# Patient Record
Sex: Female | Born: 1935 | Race: Black or African American | Hispanic: No | Marital: Single | Smoking: Never smoker
Health system: Southern US, Community
[De-identification: ages and names within clinical notes are randomized; demographics above are authoritative.]

## PROBLEM LIST (undated history)

## (undated) DIAGNOSIS — F0280 Dementia in other diseases classified elsewhere without behavioral disturbance: Secondary | ICD-10-CM

## (undated) DIAGNOSIS — L129 Pemphigoid, unspecified: Secondary | ICD-10-CM

## (undated) DIAGNOSIS — G309 Alzheimer's disease, unspecified: Secondary | ICD-10-CM

## (undated) DIAGNOSIS — R609 Edema, unspecified: Secondary | ICD-10-CM

## (undated) DIAGNOSIS — F0390 Unspecified dementia without behavioral disturbance: Secondary | ICD-10-CM

## (undated) DIAGNOSIS — K219 Gastro-esophageal reflux disease without esophagitis: Secondary | ICD-10-CM

## (undated) DIAGNOSIS — I252 Old myocardial infarction: Secondary | ICD-10-CM

## (undated) DIAGNOSIS — M810 Age-related osteoporosis without current pathological fracture: Secondary | ICD-10-CM

## (undated) DIAGNOSIS — I2510 Atherosclerotic heart disease of native coronary artery without angina pectoris: Secondary | ICD-10-CM

## (undated) DIAGNOSIS — E46 Unspecified protein-calorie malnutrition: Secondary | ICD-10-CM

---

## 1998-09-11 ENCOUNTER — Emergency Department (HOSPITAL_COMMUNITY): Admission: EM | Admit: 1998-09-11 | Discharge: 1998-09-11 | Payer: Self-pay | Admitting: *Deleted

## 1998-09-17 ENCOUNTER — Emergency Department (HOSPITAL_COMMUNITY): Admission: EM | Admit: 1998-09-17 | Discharge: 1998-09-17 | Payer: Self-pay | Admitting: Emergency Medicine

## 1999-09-22 ENCOUNTER — Emergency Department (HOSPITAL_COMMUNITY): Admission: EM | Admit: 1999-09-22 | Discharge: 1999-09-22 | Payer: Self-pay | Admitting: Emergency Medicine

## 2000-07-18 ENCOUNTER — Emergency Department (HOSPITAL_COMMUNITY): Admission: EM | Admit: 2000-07-18 | Discharge: 2000-07-18 | Payer: Self-pay | Admitting: Emergency Medicine

## 2000-07-18 ENCOUNTER — Encounter: Payer: Self-pay | Admitting: Emergency Medicine

## 2001-01-21 ENCOUNTER — Inpatient Hospital Stay (HOSPITAL_COMMUNITY): Admission: EM | Admit: 2001-01-21 | Discharge: 2001-02-01 | Payer: Self-pay | Admitting: Emergency Medicine

## 2001-01-21 ENCOUNTER — Encounter: Payer: Self-pay | Admitting: Internal Medicine

## 2001-01-26 ENCOUNTER — Encounter: Payer: Self-pay | Admitting: Internal Medicine

## 2001-01-29 ENCOUNTER — Encounter: Payer: Self-pay | Admitting: Internal Medicine

## 2003-06-27 ENCOUNTER — Inpatient Hospital Stay (HOSPITAL_COMMUNITY): Admission: EM | Admit: 2003-06-27 | Discharge: 2003-07-03 | Payer: Self-pay | Admitting: Emergency Medicine

## 2003-06-29 ENCOUNTER — Encounter: Payer: Self-pay | Admitting: Internal Medicine

## 2003-07-07 ENCOUNTER — Inpatient Hospital Stay (HOSPITAL_COMMUNITY): Admission: RE | Admit: 2003-07-07 | Discharge: 2003-07-10 | Payer: Self-pay | Admitting: Internal Medicine

## 2003-10-14 ENCOUNTER — Emergency Department (HOSPITAL_COMMUNITY): Admission: EM | Admit: 2003-10-14 | Discharge: 2003-10-14 | Payer: Self-pay | Admitting: Emergency Medicine

## 2003-12-31 ENCOUNTER — Emergency Department (HOSPITAL_COMMUNITY): Admission: EM | Admit: 2003-12-31 | Discharge: 2003-12-31 | Payer: Self-pay | Admitting: Emergency Medicine

## 2005-01-10 IMAGING — CR DG CHEST 2V
2 series · 2 of 2 positions shown · non-contrast
Comparison: 06/27/2003.

CLINICAL DATA: Syncope. 
 CHEST (TWO VIEWS)

[view not recorded (1 of 2)]
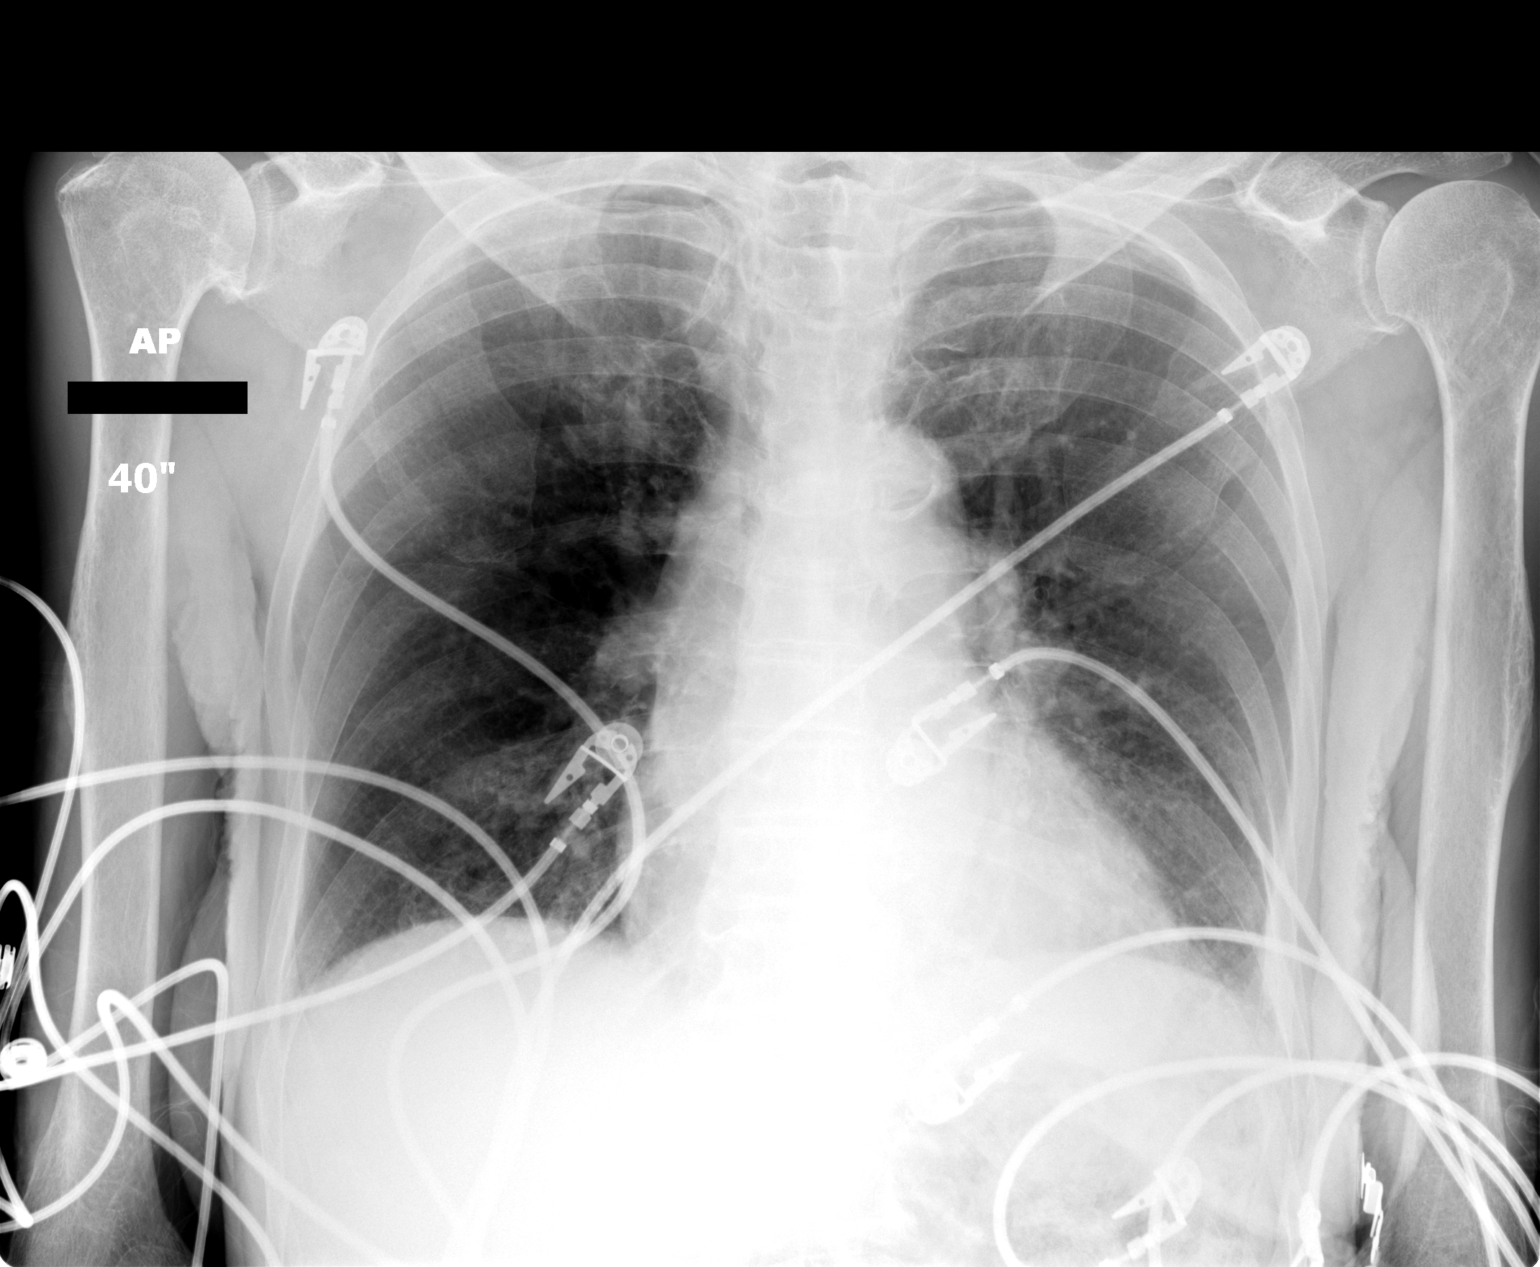

[view not recorded (2 of 2)]
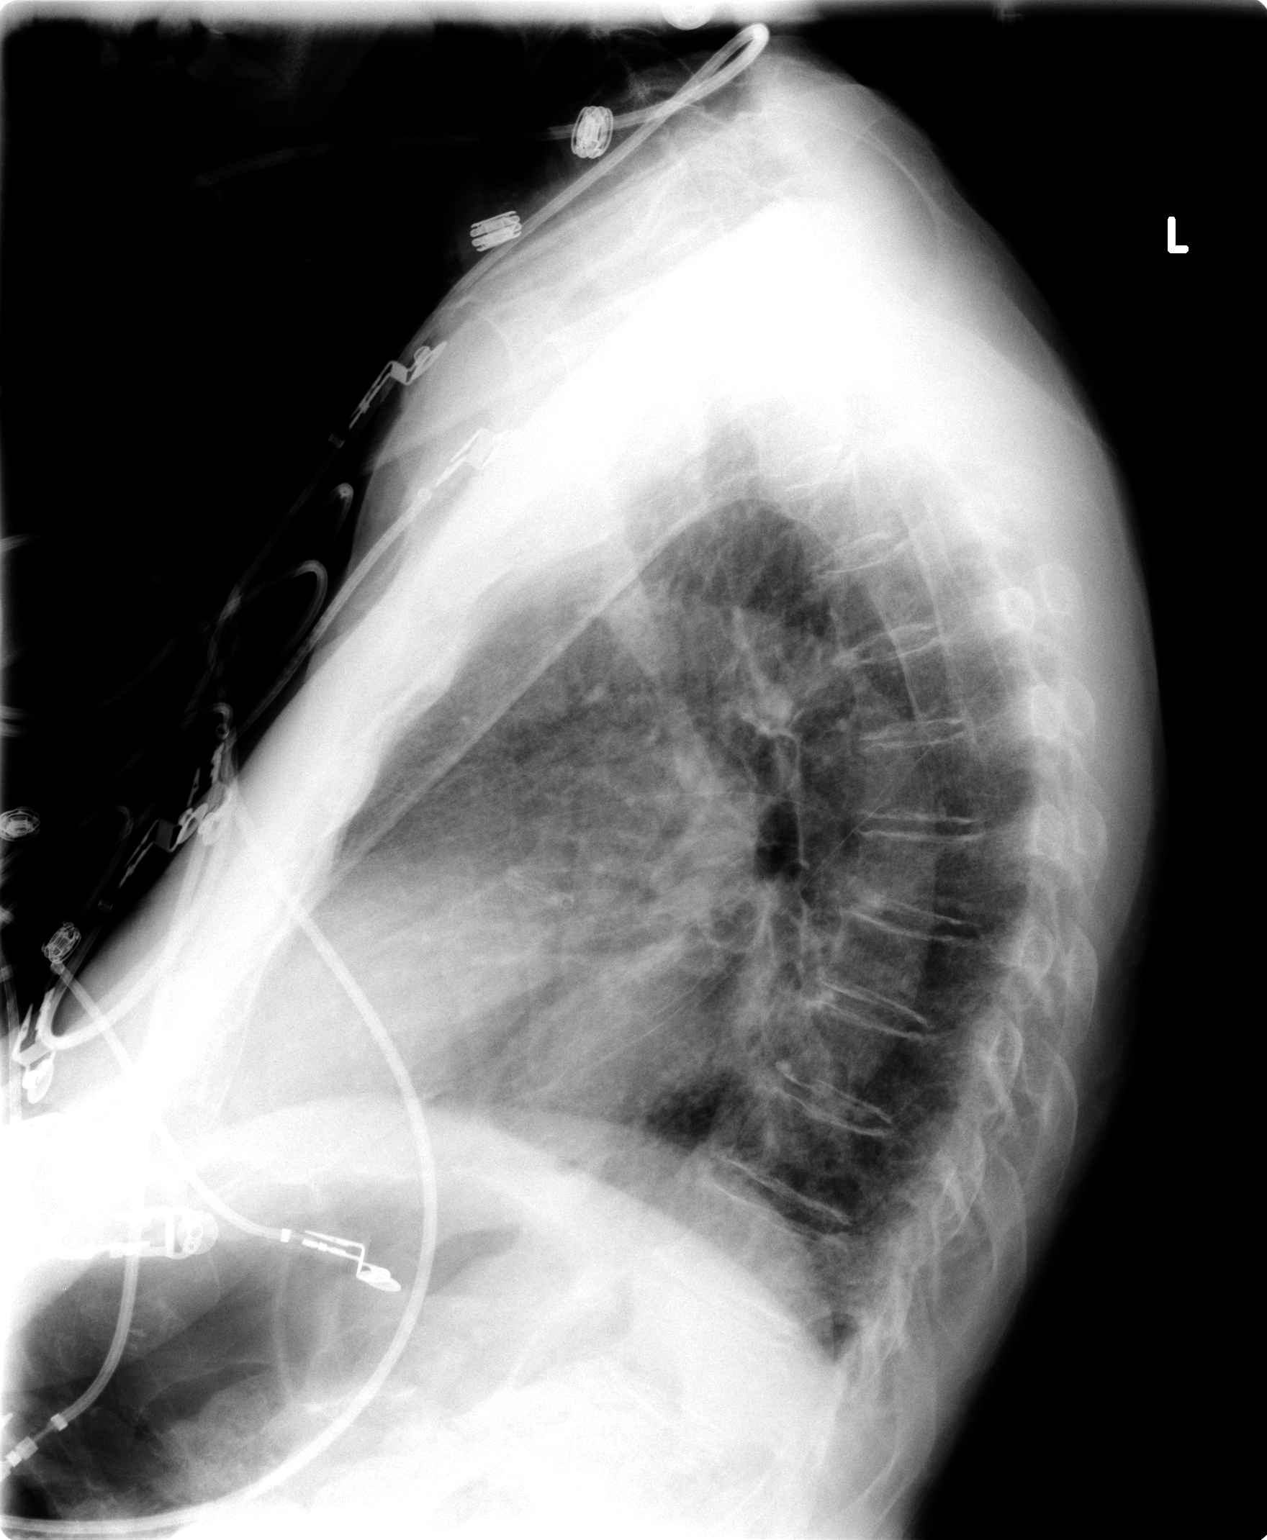

[2 of 2 positions shown; findings below may reference images not displayed]

Cardiomegaly.   Calcified tortuous aorta.  Vascularity normal.  Lower lung volumes than on previous study.  However, no acute infiltrative or effusions seen.  Osteoporosis.
 IMPRESSION
 Cardiomegaly.  No acute abnormalities.

## 2005-01-10 IMAGING — CT CT HEAD W/O CM
1 of 2 series · 13 of 30 positions shown, 17 images · non-contrast
Comparison: none

CLINICAL DATA: Syncope.  Left frontal hematoma.

[Series 2: brain · axial · 0.47mm/px · z∈[+107,+228]mm · 13 of 28 slices shown, 17 images]
[im 2/28  brain]
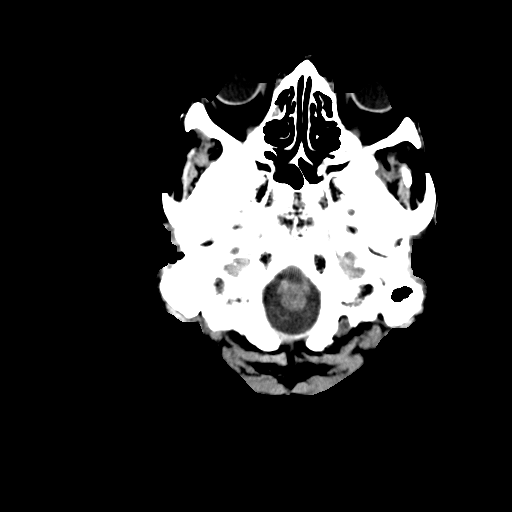
[im 2/28  bone]
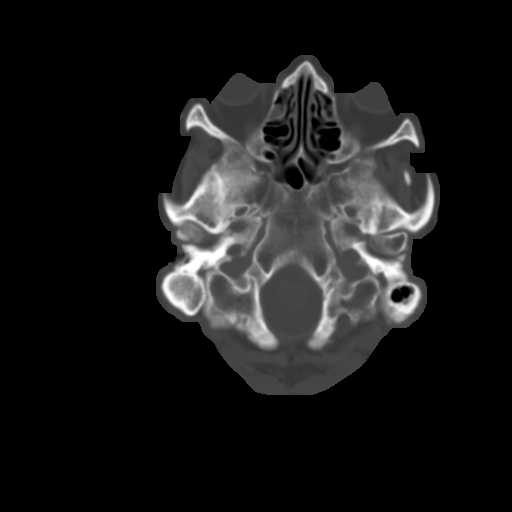
[im 4/28  brain]
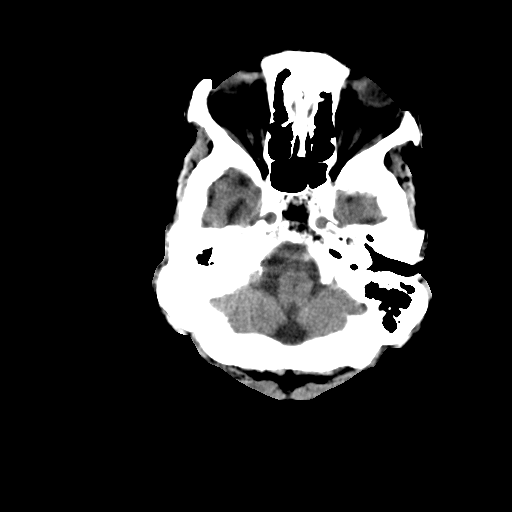
[im 6/28  brain]
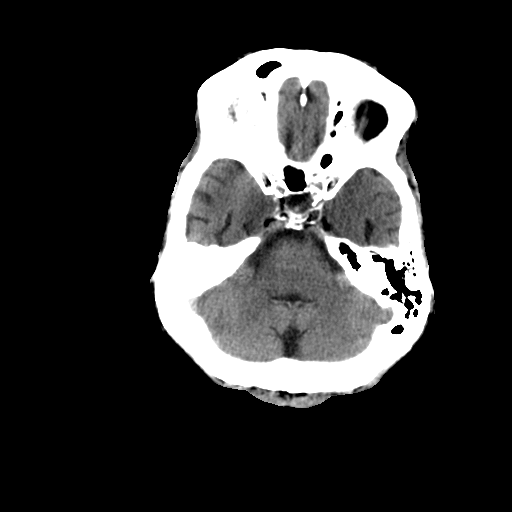
[im 8/28  brain]
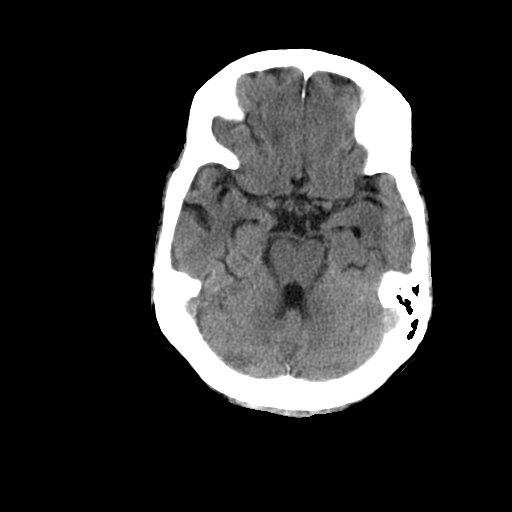
[im 10/28  brain]
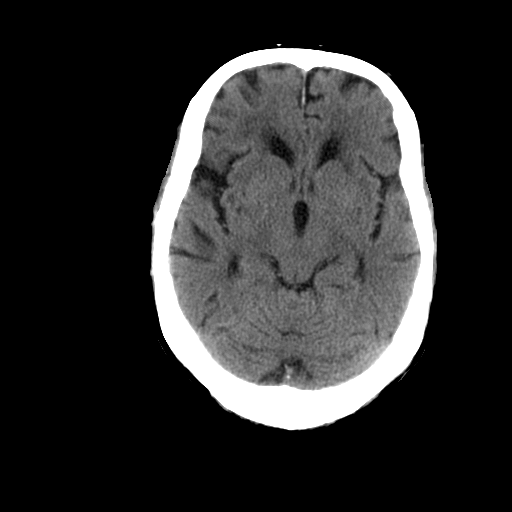
[im 10/28  bone]
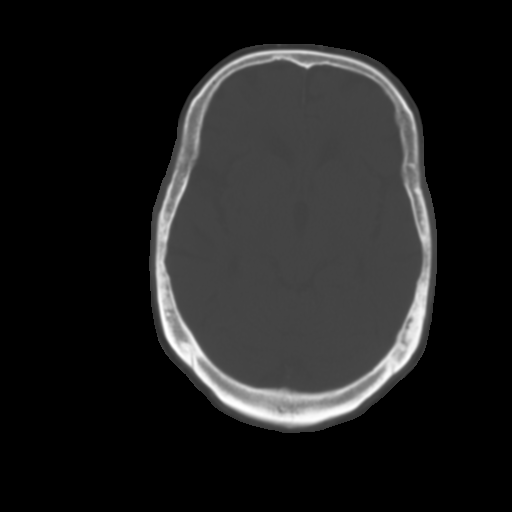
[im 12/28  brain]
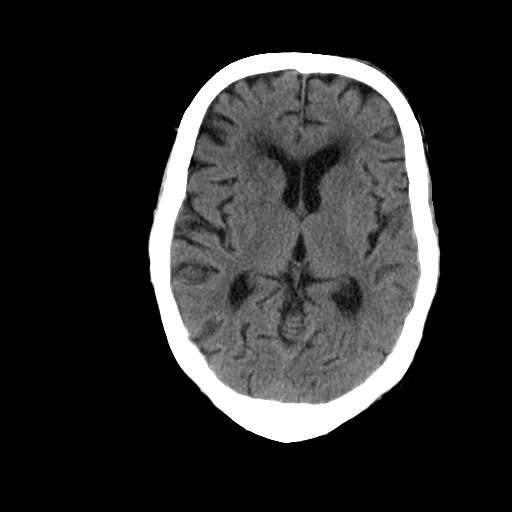
[im 14/28  brain]
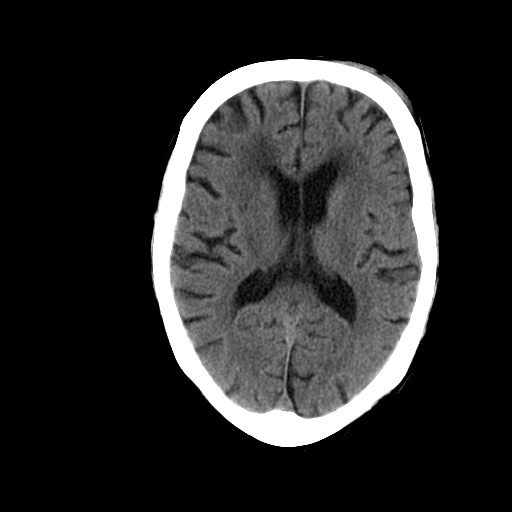
[im 16/28  brain]
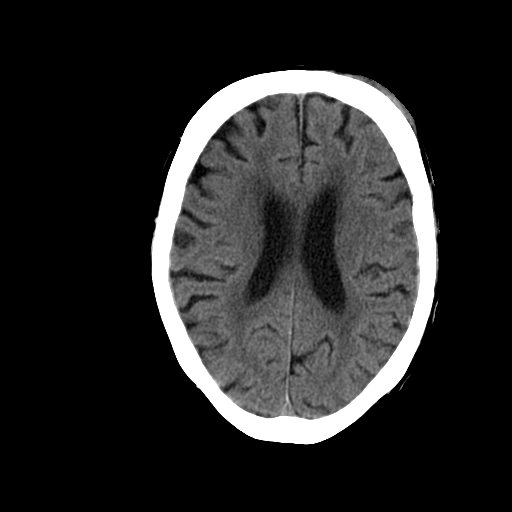
[im 18/28  brain]
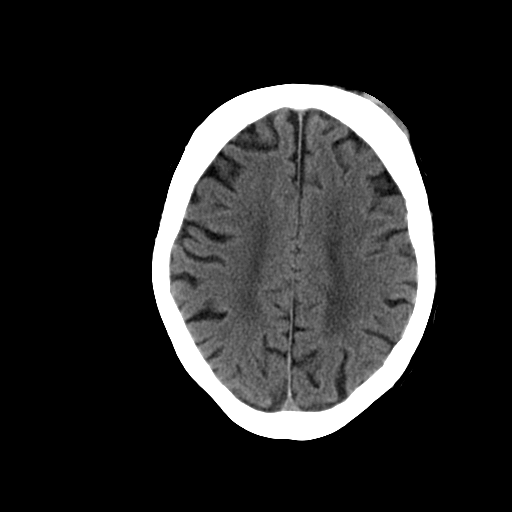
[im 18/28  bone]
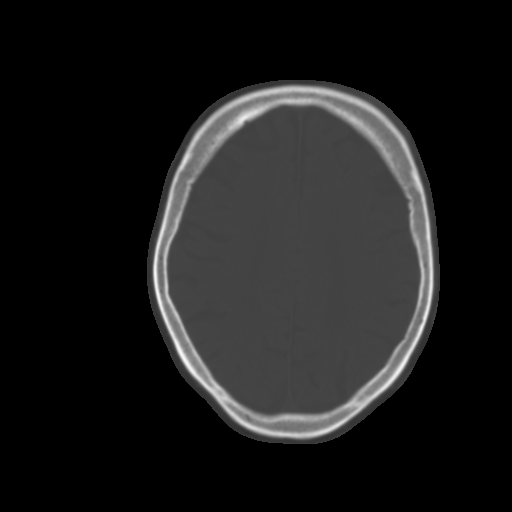
[im 20/28  brain]
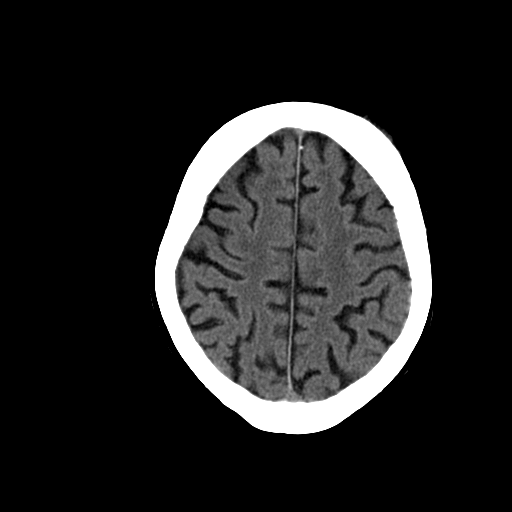
[im 22/28  brain]
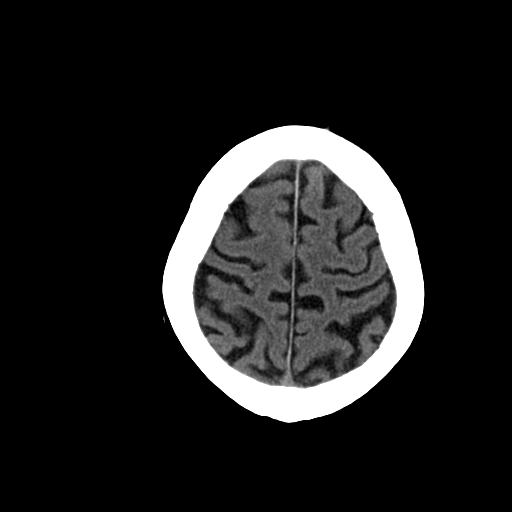
[im 24/28  brain]
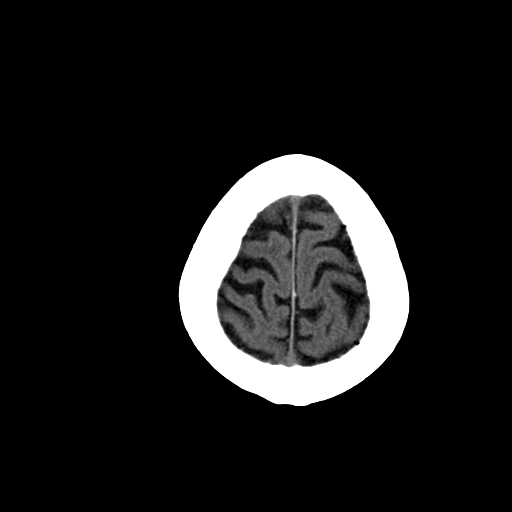
[im 26/28  brain]
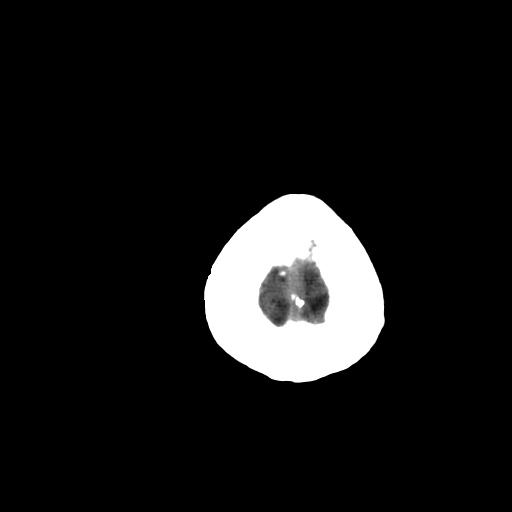
[im 26/28  bone]
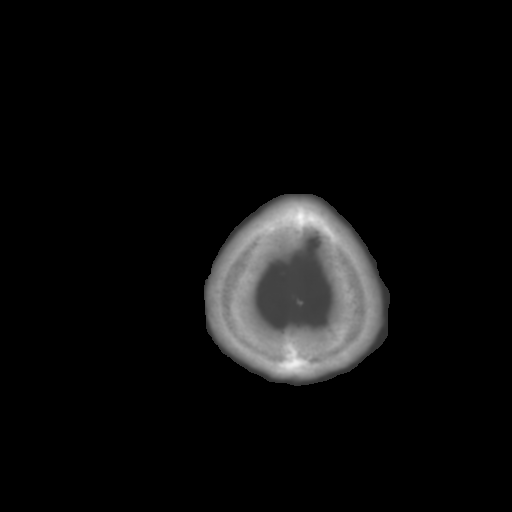

[13 of 30 positions shown; findings below may reference images not displayed]

CT HEAD WITHOUT CONTRAST
 Routine noncontrast CT head compared to 01/26/01.  Generalized atrophy.  Small vessel chronic ischemic changes of the deep cerebral white matter.  No midline shift or mass effect.  No intracranial hemorrhage, evidence of acute infarct.  Calvarium intact.  Left frontal scalp hematoma noted.  

 IMPRESSION
 Left frontal scalp hematoma.  No acute intracranial abnormalities. 

 [REDACTED]

## 2005-03-03 ENCOUNTER — Inpatient Hospital Stay (HOSPITAL_COMMUNITY): Admission: EM | Admit: 2005-03-03 | Discharge: 2005-03-11 | Payer: Self-pay | Admitting: Emergency Medicine

## 2005-03-03 ENCOUNTER — Ambulatory Visit: Payer: Self-pay | Admitting: Infectious Diseases

## 2007-10-08 ENCOUNTER — Emergency Department (HOSPITAL_COMMUNITY): Admission: EM | Admit: 2007-10-08 | Discharge: 2007-10-09 | Payer: Self-pay | Admitting: Emergency Medicine

## 2007-11-25 ENCOUNTER — Emergency Department (HOSPITAL_COMMUNITY): Admission: EM | Admit: 2007-11-25 | Discharge: 2007-11-25 | Payer: Self-pay | Admitting: Emergency Medicine

## 2010-11-08 NOTE — Consult Note (Signed)
[place]. [facility]  Patient:    [name] [name], [name] [name]                      MRN: [id number] Proc. Date: 01/26/01 Adm. Date:  [id number] Disc. Date: [id number] Attending:  Georgann Housekeeper CC:         Thora Lance, M.D.   Consultation Report  REFERRING:  Thora Lance, M.D.  CHIEF COMPLAINT:  Respiratory arrest and abnormal EKG.  HISTORY OF PRESENT ILLNESS:  This is a 77 year old African-American female taken care of by [facility] who was admitted on January 21, 2001 with complaints of 4-5 day history of hallucinations and delusions.  The patient was found to have a positive RPR with no history of syphilis in the past. Also, has a history of hypertension.  Upon admission, she was hypokalemic and dehydrated felt secondary to poor nutrition.  Her delusions initially appeared to resolve after repletion of potassium and IV fluids.  She was found to be anemic as well on admission.  On August 4, she began hallucinating again.  A 2-D echocardiogram was done showing normal LV size and systolic function. Diastolic dysfunction was present with mild tricuspid regurgitation.  EKG on admission showed sinus bradycardia with 1-2 mm of J point elevation in V1-V4 with prominent R waves and increased voltage in V2 and V3 with biphasic T wave changes in V3-V6 and LVH by voltage criteria.  The ST segments have transiently changed with a degree of LVH noted on EKG.  Today at 1435, a Code Blue was called secondary to respiratory distress.  Apparently, the patient stopped breathing momentarily.  She never lost her pulse or blood pressure. The patient began mentating approximately 15 minutes after the event started. EKG at that time showed very slight J point elevation from baseline in V2-V4. V2 had appeared to be 3 mm of J point elevation, V3 2 mm of J point elevation, and V4 1-2 mm of J point elevation.  This was slightly increased from her initial EKG done on admission.  Her blood  pressure during the event was 176/96 with a heart rate of 70, temperature 100.1.  The patient is a very poor historian but is somewhat awake now and denies ever having had chest pain.  PAST MEDICAL HISTORY:  Hypertension, questionable CVA two years ago, depression, Alzheimers, hallucinations and delusions, positive RPR with no history of syphilis.  ALLERGIES:  None.  SOCIAL HISTORY:  She lives with a daughter.  She is single.  Denies tobacco or alcohol use.  She uses a walker at home.  FAMILY HISTORY:  Positive for hypertension and CVA in her mother.  PHYSICAL EXAMINATION:  VITAL SIGNS:  Blood pressure 140-170/60 mmHg, heart rate 80-90, respiratory rate 20.  O2 saturations 100% on 2 L.  GENERAL:  This is a thin black female in no acute distress, somewhat sedated, but appropriate and answers questions.  HEENT:  Benign except for poor dentition.  NECK:  Supple without lymphadenopathy.  No bruits.  LUNGS:  Clear to auscultation throughout.  HEART:  Regular rate and rhythm.  No murmurs, rubs, or gallops.  Normal S1, S2.  ABDOMEN:  Soft, nontender, nondistended with active bowel sounds.  No hepatosplenomegaly.  EXTREMITIES:  No cyanosis, erythema, or edema.  LABORATORY DATA:  Sodium 139, potassium 4.0, chloride 109, CO2 24, BUN 17, creatinine 1, glucose 132.  White cell count 6.1, hemoglobin 11.7, hematocrit 35.4, platelet count 258.  LFTs within normal limits.  CPK 306,  MB 2.3, normal relative index, troponin 0.02.  EKG #1 done today shows normal sinus rhythm at 69 beats per minute with LVH by voltage with J point elevation 2-3 mm in V1-V4.  This about 1 mm more than her baseline EKG on admission.  EKG #2 done after the event shows normal sinus rhythm with J point elevation which has decreased to the same as on admission in the precordial leads.  EKG also shows LVH with stream pattern.  ASSESSMENT:  Transient respiratory distress with stable hemodynamics, questionable  etiology, probable drug interaction, unlikely to represent a cardiac event with normal blood pressure and heart rate, as well as normal heart rhythm during the event.  EKG does show J point elevation of 1-2 mm in V1-V4 on admission and slight increase after her respiratory distress but repeat EKG now shows no changes from the admission EKG.  The patient does have prominent LVH on EKG, which may be affecting ST changes on her EKG (LVH with repolarization changes).  I do not think this represents an acute MI.  Her initial cardiac enzymes with the CPK and MB and troponin-I are negative.  It is difficult to get a history out of her because she denies any chest pain at present.  PLAN:  Follow CPK, MB, enzymes, and troponins.  At present, I would not take this patient to cardiac catheterization, unless she has some evidence of acute myocardial ischemia noted with a cardiac enzyme bump.  Agree with IV nitroglycerin, aspirin, and oxygen in the interim.  Chest CT and head CT are pending.  Needs aggressive blood pressure management.  We will follow with you. DD:  01/26/01 TD:  01/27/01 Job: [id number]

## 2010-11-08 NOTE — Cardiovascular Report (Signed)
NAMECLARESSA, [name] [name]                         ACCOUNT NO.:  [id number]   MEDICAL RECORD NO.:  [id number]                   PATIENT TYPE:  INP   LOCATION:  2930                                 FACILITY:  [facility]   PHYSICIAN:  Arturo Morton. Riley Kill, M.D.             DATE OF BIRTH:  1922-02-16   DATE OF PROCEDURE:  DATE OF DISCHARGE:  07/03/2003                              CARDIAC CATHETERIZATION   INDICATION:  The patient is an elderly, 77 year old woman with poor  functional status, who presents to the emergency room with evidence of ST  elevation myocardial infarction.  She was brought to the laboratory  emergently for percutaneous reperfusion.   PROCEDURE:  1. Left-heart catheterization.  2. Selective coronary arteriography.  3. Selective left ventriculography.  4. Percutaneous stenting of the left anterior descending artery.   DESCRIPTION OF PROCEDURE:  The patient was brought to the catheterization  laboratory emergently for acute reperfusion therapy.  Coronary arteriography  was performed with views obtained of the left and right coronary arteries.  This was followed by left ventriculography.  With appropriate  anticoagulation, the left anterior descending artery was noted to be totally  occluded.  A 6-French FL4 guiding catheter was utilized with a high-torque  floppy wire.  We were able to cross into the left anterior descending  artery, and initial dilatation was performed with a 2.5 x15 Guidant Voyager  Balloon at 2, 4 and 6 atmospheres.  Because of the patient's uncertain  status, it was felt that non-DES stenting would be the most appropriate  approach, given need for prolonged antiplatelet therapy.  A 2.75 x24 Express  Stent was then placed with careful attention to location.  It required  pulling this up to near the takeoff of the left anterior descending artery,  and then this was deployed at 12 atmospheres.  Following that, a 2.75  Express was then taken distally for a  focal lesion just beyond the  previously-placed stent.  This was deployed.  The 2.75 Express balloon was  then used to do multiple high-pressure inflations throughout the previously  placed stent.  The patient had TIMI 0 flow at the beginning of the  procedure, and TIMI 3 flow at the completion of the procedure.  Stenosis was  totally occluded and went to 0 percent at the completion of the procedure.  Integrilin was used as a glycoprotein inhibitor.  Arrival at the facility  was at 1545, and first balloon inflation was at 1719, and reperfusion at  1715 for a total reperfusion time of 90 minutes.  All catheters were  subsequently removed.  The femoral sheath was sewn into place, and the  patient was taken to the coronary care unit in satisfactory clinical  condition.   HEMODYNAMIC DATA:  1. Central aortic pressure 132/74, mean 101.  2. Left ventricle 120/16.  LVEDP at 28.  3. Approximate 8 mm gradient on pullback across the aortic valve.  ANGIOGRAPHIC DATA:  The left main coronary artery is large in caliber,  somewhat calcified, but without significant high-grade focal narrowing.   The left anterior descending artery at the beginning of the procedure  demonstrated a 70-80% proximal area of narrowing followed by a total  occlusion just after the takeoff of the small diagonal branch.  Following  reperfusion therapy, the entire proximal segment was reduced from 100% to 0%  and residual luminal narrowing with TIMI 3 flow at the completion of the  procedure.  There was some diffuse, segmental irregularity of 50% in the mid  left anterior descending artery.  However, there was good antegrade flow  into the distal vessel.   The circumflex coronary artery is fairly calcified and provides a first  marginal branch which is about 30 to 40% ostial narrowing.  There is perhaps  40 to 50% mid narrowing in this vessel.  The AV circumflex just beyond this  has probably about 50% luminal  irregularity.  The second obtuse marginal  branch is totally occluded and fills by retrograde collaterals.  The AV  circumflex provides a third marginal branch.  There is about 40-50%  narrowing in the AV circumflex.  However, none of these areas appear to be  critical.   The right ostium is calcified with about 40% ostial narrowing, and then 40-  50% mid narrowing in the mid vessel.  The distal vessel provides a posterior  descending and posterolateral branch.   VENTRICULOGRAPHY:  Ventriculography performed in the RAO projection reveals  an ejection fraction estimated in the range of about 30 to 35%.  The  anteroapical and distal inferior wall all appear to be akinetic.  There is  mild mitral regurgitation.  Some of this is catheter induced from the  ventricular ectopy.   CONCLUSIONS:  1. Moderately-severe reduction in left ventricular function with an     anteroapical wall-motion abnormality corresponding to the left anterior     descending territory.  2. 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  3. Scattered coronary abnormalities as described in the above text.   DISPOSITION:  The patient's baseline status is poor.  She will be  transferred to the coronary care unit and stabilized.  Non-DES stenting was  utilized because of the uncertainty of use of long-term Plavix.  At the  present time, she will be continued on dual antiplatelet therapy.                                               Arturo Morton. Riley Kill, M.D.    TDS/MEDQ  D:  07/22/2003  T:  07/23/2003  Job:  [id number]   cc:   [facility]

## 2010-11-08 NOTE — Discharge Summary (Signed)
[place]  Patient:    [name] [name], [name] [name]                      MRN: [id number] Adm. Date:  [id number] Disc. Date: [id number] Attending:  Georgann Housekeeper                           Discharge Summary  REASON FOR ADMISSION:  This is a 77 year old African-American female, a chronic patient of [facility], who was brought to the emergency room complaining of 4-5 days of having hallucinations and delusions.  She was brought by her daughter, with whom she lives.  The patient reported seeing alligators in her home and had seen a bald man come into her house.  She was talking to a boy outside of her home when no one was present.  She had been seeing mice.  The patients daughter did give a history of chronic memory loss and symptoms consistent with dementia over at least a two-year period of time.  PAST MEDICAL HISTORY:  The patients past medical history is remarkable for a history of hypertension not taking any medications over the last year, possibly a small stroke about two years ago, and a question of depression.  PHYSICAL EXAMINATION:  VITAL SIGNS:  Blood pressure 212/105, dropped to 190/90 when checked by the M.D.  Heart rate 87, temperature 97.0, respirations 18.  Oxygen saturation 95% on room air.  GENERAL:  A mildly cachectic elderly female who was alert and oriented x 2. The rest of her exam was unremarkable.  NEUROLOGIC:  She was alert and oriented x 2.  Cranial nerves II-XII are intact.  Strength was 4+/5 in the lower extremities and 5/5 in the upper extremities.  Reflexes were 1+ and symmetric with the right toe upgoing and the left toe equivocal.  ADMISSION LABORATORY DATA:  CBC:  WBC 5.1, hemoglobin 10.7, platelet count 241.  Arterial blood gas:  pH 7.36, PCO2 42, PO2 123 on room air. Chemistries:  Sodium 142, potassium 2.3, chloride 105, bicarbonate 30, BUN 9, creatinine 0.7, glucose 80, calcium 8.7, total protein 6.9, albumin 3.5,  AST 16, ALT 12, alkaline phosphatase 62, total bilirubin 0.9.  Creatinine kinase 417, creatinine kinase MB 8.1, troponin-I 0.02.  TSH 0.925.  Urine drug screen negative.  Urinalysis was unremarkable.  Chest x-ray showed no acute disease.  EKG showed sinus bradycardia, nonspecific T-wave changes, and possible LVH.  HOSPITAL COURSE: #1 - HALLUCINATIONS AND DEMENTIA:  The patient was admitted mainly because of troubling visual hallucinations.  She had a CT scan of the brain which showed small vessel disease but no acute changes.  After obtaining more history from the patients daughter, it was obvious she had dementia of moderate to severe severity.  This was felt either to be Alzheimers dementia or possibly a vascular dementia.  The patient continued to have hallucinations during the first several days of her hospitalization.  She was frequently agitated and had trouble with sundowning.  She was treated with a low dose of Ativan IV on a p.r.n. basis.  She was also started on Zyprexa 2.5 mg titrated up to 5 mg q.d.  When after having a respiratory arrest, this was discontinued.  She was briefly treated with Aricept, which also was discontinued after respiratory arrest.  She continued to be intermittently confused and sometimes agitated during her hospitalization.  Her behavioral symptoms were much easier to control when her family  members were present.  A Posey belt was used at times for patient safety.  #2 - RESPIRATORY ARREST:  On August 6, the patient suddenly stopped breathing. A code was called.  She maintained a normal blood pressure and normal heart rhythm with no evidence of arrhythmia.  She was briefly ventilated with a bag ventilator and within 15-20 minutes her status returned to normal.  An EKG at the time showed possible ST elevations in leads V2-V4, which was reviewed by Dr. Armanda Magic, cardiology, who felt that her changes were not indicative of an MI.  Her cardiac enzymes  proved to be negative.  A CT scan of the head was done to rule out a CVA (she had been noted to drag her left leg earlier that day) and was, as above, no acute disease.  CT scan of the chest showed no pulmonary embolus.  The patient was briefly treated with IV nitroglycerin but this was stopped after she ruled out for an MI.  She had another episode of severe hypotension later in the hospitalization, as described below.  After the brief respiratory arrest, her Aricept and Zyprexa were discontinued.  #3 - HYPERTENSION:  The patient was hypertensive at admission and had had a history of hypertension.  Her blood pressure was quite elevated at times, often in conjunction with confusion and agitation.  She was placed on Vasotec at the beginning of her hospitalization.  When the patient was calm, her blood pressure was quite well controlled.  On August 8, the patient had an episode of hypotension while sitting upright in a wheelchair with systolic blood pressure dropping into the 70s.  She was briefly nonresponsive but after lying down and having oxygen applied her blood pressure came up and she again became responsive.  She was given IV fluids and transferred briefly to the ICU.  Her Vasotec was discontinued.  The dose of her Risperdal was decreased.  The next day, orthostatic vital signs were checked and she was negative for orthostasis.  Her blood pressure remained stable throughout the rest of the hospitalization with no further episodes of hypotension off her Vasotec.  #4 - LOW-GRADE FEVER:  The patient had a low-grade fever on August 6. Urinalysis and chest x-ray were negative and no obvious source of infection was identified.  #5 - HYPOKALEMIA:  The patient had significant hypokalemia at admission, which was replaced.  By August 3, her potassium was 4.7 and remained normal throughout the rest of her hospitalization.  #6 - POSITIVE RAPID PLASMA REAGIN:  The patient had an RPR drawn for  her dementia workup which was positive at 1:2.  A TTPA test was positive. Infectious disease was consulted.  Dr. Roxan Hockey felt that the patients  dementia was unlikely to be related to syphilis and that the patient would be unlikely to benefit from treatment for syphilis.  This was discussed with the family and they agree with not treating.  #7 - SYSTOLIC MURMUR:  The patient was noted to have a murmur at admission. An echocardiogram was obtained and showed increased turbulence through the left ventricular outflow tract with minimal outflow tract gradient of 14 mmHg. Left ventricular systolic function was normal.  EF was estimated between 55-65%.  Diastolic dysfunction was documented.  Aortic valve thickness was mildly increased but there was no significant stenosis.  #8 - IRON DEFICIENCY:  The patient was found to have a mild anemia at admission.  Iron studies showed an iron level of 36, TIBC of 237, and  a percent saturation of 15%, suggesting mild iron deficiency.  The patient was placed on iron.  Stool for occult blood was negative x 2.  #9 - FALL:  On August 9, the patient fell after getting out of her bed and attempting to ambulate.  She complained of some left hip pain.  An x-ray of the hip showed no fracture but did suggest a chronic dislocation of the left hip.  An orthopedics consult for this is pending at this time.  #10 - CODE STATUS:  Code status was discussed with the family and the patients family desired a full code status.  DISCHARGE DIAGNOSES: 1. Dementia. 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 3. Respiratory arrest. 4. Hypertension. 5. Episode of severe hypotension. 6. Hypokalemia. 7. Iron-deficiency anemia. 8. Fall.  PROCEDURES: 1. CT scan of the brain. 2. CT scan of the chest. 3. Echocardiogram.  DISCHARGE MEDICATIONS: 1. Iron sulfate 325 mg 1 p.o. q.d. 2. Risperdal 0.5 mg q.12h. p.r.n. agitation or anxiety.  DIET:  Regular.  DISPOSITION:  The patient  was discharged to [facility] nursing home.  She will likely need a skilled level of care initially but ultimately may be able to transition to an intermediate care status.  She may benefit from physical therapy.  CODE STATUS:  Full code. DD:  01/30/01 TD:  01/30/01 Job: [id number]

## 2010-11-08 NOTE — Discharge Summary (Signed)
. [facility]  Patient:    [name] [name], [name] [name]                      MRN: [id number] Adm. Date:  [id number] Disc. Date: [id number] Attending:  Georgann Housekeeper                           Discharge Summary  CONTINUATION:  ADDENDUM:  The patient was seen by Dr. Otelia Sergeant of the orthopedics service on January 30, 2001.  His diagnosis was chronic left hip dislocation.  He did not recommend any treatment of the chronically dislocated left hip.  He felt the patient would be able to weightbear as tolerated on the left leg.  He did say the patient may need a shoe lift. DD:  02/01/01 TD:  02/01/01 Job: [id number]

## 2010-11-08 NOTE — H&P (Signed)
NAMELATEKA, RADY                         ACCOUNT NO.:  [id number]   MEDICAL RECORD NO.:  [id number]                   PATIENT TYPE:  EMS   LOCATION:  MAJO                                 FACILITY:  [facility]   PHYSICIAN:  [name], M.D.                   DATE OF BIRTH:  04-08-1922   DATE OF ADMISSION:  06/27/2003  DATE OF DISCHARGE:                                HISTORY & PHYSICAL   CHIEF COMPLAINT:  Chest pain.   HISTORY OF PRESENT ILLNESS:  The patient is a 77 year old black female who  presents for assessment of chest discomfort.  The patient has no prior  cardiac history.  She is a resident of [facility] with a history  of hypertension and tobacco use.  She has not had any recent chest pain or  shortness of breath, however, today at approximately lunchtime she developed  midsternal chest discomfort with mild shortness of breath.  She was able to  tell her nurses and they subsequently called EMS and she was transferred to  [facility] emergently where EKG showed ST elevation in the anterior  lead consistent with acute anterior wall myocardial infarction.  The patient  had slightly reduced blood pressure and heart rate in the nursing home, but  denies any dizziness or lightheadedness.  She has not had any recent  syncope, although, she has had a remote history of syncope with  noncontributory workup.  She has not had any lower extremity edema,  orthopnea, or paroxysmal nocturnal dyspnea.  No bright red blood per rectum,  melena, or hematuria.   REVIEW OF SYSTEMS:  Otherwise noncontributory, but is somewhat limited due  to mild dementia.  History is also obtained from the patient's daughter.   PAST MEDICAL HISTORY:  Notable for hypertension, dementia, syncope, and iron  deficiency anemia.  She has not had any surgeries.   ALLERGIES:  No known drug allergies.   MEDICATIONS:  1. Iron 325 mg daily.  2. Zyprexa 2.5 mg a day.  3. Remeron 50 mg daily.   SOCIAL HISTORY:  The patient is widowed and has two siblings and two  daughters without medical problems.  She lives at [place].  She has a  history of tobacco use of greater than one pack per day for most of her  adult life, quit a year ago.  Denies alcohol use.   FAMILY HISTORY:  Noncontributory.   PHYSICAL EXAMINATION:  GENERAL:  She is a frail, elderly, black female in no  acute distress.  VITAL SIGNS:  Afebrile, pulse 78, normal sinus rhythm, respiratory rate 18,  blood pressure 115/68.  HEENT:  Pupils equal, round, and reactive to light.  There are cataracts  bilaterally with arcus senilis.  Extraocular muscles are intact.  Oropharynx  shows poor dentition with significant loss of teeth, some necrosis noted on  her lower front teeth.  No upper teeth are  noted.  Mucous membranes are dry.  NECK:  Supple.  No bruits, no adenopathy.  HEART:  Regular rate and rhythm without murmurs, rubs, or gallops.  LUNGS:  Clear to auscultation bilaterally.  ABDOMEN:  Soft and nontender.  EXTREMITIES:  No edema.  Peripheral pulses are diminished, but palpable.  Motor strength is 5/5.  Sensory is grossly intact to touch.   EKG shows normal sinus rhythm at 77, ST elevation of greater than 1 mm in  the anterior leads.   LABORATORY DATA:  Hemoglobin 12.6, hematocrit 37.  Sodium 138, potassium  4.1, chloride 106, bicarb 35, BUN 28, creatinine 1.0, glucose 143.   ASSESSMENT:  The patient is a 77 year old black female who presents with  acute onset of chest discomfort with EKG findings of anterior wall  myocardial infarction.  Treatment options were discussed with the patient  and her daughter including medical therapy, thrombolytics, and coronary  angiography with an eye toward percutaneous intervention.  The risks,  benefits, and alternatives of each were discussed in detail and the patient  and her daughter wish to proceed with cardiac catheterization.  At this  point, the patient is a full code.   We will anticoagulate with aspirin and  Plavix as well as heparin as needed.  She will receive further  anticoagulation at the time of her coagulation.  We will consider addition  of beta blocker as hemodynamics allow and risk factor modification will be  pursued following her angiography.                                                [name], M.D.    NG/MEDQ  D:  06/27/2003  T:  06/27/2003  Job:  [id number]   cc:   Maxwell Caul, M.D.  [address]  [place]  [place] [address]  Fax: [phone]

## 2010-11-08 NOTE — Procedures (Signed)
REFERRING PHYSICIAN:  Arturo Morton. Riley Kill, M.D.   CLINICAL HISTORY:  A 77 year old lady with an episode of sudden onset of  sleepiness and unresponsiveness.  Evaluate for epileptiform activity.   MEDICATIONS LISTED:  Aspirin, Plavix, Ativan, Lipitor, Protonix, Remeron,  Zyprexa, and ferrous sulfate.   This is a portable 17-channel EEG recorded using standard 10/20 electrode  placement.   The EEG record shows a posterior dominant rhythm of 9-10 Hz alpha which is  of diminished amplitude, synchronous and reactive to eye opening and  closure.  A minor amount of intermittent 6-7 Hz theta activity is seen.  Drowsiness is noted in this tracing, but stages of definitive sleep are not  seen.  No paroxysmal epileptiform activity, focal or diffuse abnormalities,  are seen.  The length of the tracing is 20.4 minutes.  The technical  component is adequate.  The EKG tracing shows sinus rhythm.   IMPRESSION:  This electroencephalography performed during wakeful states is  within normal limits.  No definite epileptiform activities identified.    Marney Doctor, MD   XTG:GYIR  D:  12/22/2003 19:24:10  T:  12/22/2003 19:59:05  Job #:  [id number]   cc:   Arturo Morton. Riley Kill, M.D.

## 2010-11-08 NOTE — Discharge Summary (Signed)
NAMEJESSAMINE, [name]                         ACCOUNT NO.:  [id number]   MEDICAL RECORD NO.:  [id number]                   PATIENT TYPE:  INP   LOCATION:  2930                                 FACILITY:  [facility]   PHYSICIAN:  Arturo Morton. Riley Kill, M.D.             DATE OF BIRTH:  May 05, 1922   DATE OF ADMISSION:  06/27/2003  DATE OF DISCHARGE:  07/03/2003                                 DISCHARGE SUMMARY   PRIMARY CARE PHYSICIAN:  Dr. Leanord Hawking   PRIMARY CARDIOLOGIST:  Dr. [name]   PROCEDURES:  1. Cardiac catheterization.  2. Coronary arteriogram.  3. Left ventriculogram.  4. PTCA and stent x2.  5. MRI of the brain.   HOSPITAL COURSE:  [name] [name] is a 77 year old female who has no prior cardiac  history and is a resident of [name].  At approximately noon  on the day of admission she developed midsternal chest discomfort with  shortness of breath.  EMS was called and her EKG showed ST elevation in the  anterior leads and she was transferred emergently to [facility] for  further evaluation and catheterization.   The cardiac catheterization showed a normal left main and an LAD with 70%  proximal stenosis that was then totaled.  She had no other critical disease  and her EF was approximately 40% with 2+ MR.  She had two Express stents  placed to the LAD reducing the stenosis from 70% and 100% to 0% with TIMI-3  flow.  She tolerated the procedure well.  Durable in-time 90 minutes.  She  was enrolled in the APEX study.  Dr. [name] feels that she needs an echo to  assess her left ventricular recovery in 4-6 weeks.  She had low-dose beta  blockers added to her medication regimen.   She had some brief episodes of nonsustained VT as reperfusion arrhythmia but  other than that the only arrhythmia that was seen was frequent PACs.  On  June 29, 2003 she had a respiratory arrest.  She was sitting in a chair  and the nurses stated that she became unresponsive and went  apneic.  Her  heart rate decreased into the 50s and her systolic blood pressure decreased  into the 60s.  It was attempted to arouse her and respiratory assistance  with bag-valve mask was initiated.  She responded well to this and  recovered.  She had no change in her mental status after these episodes.  There was no noted seizure activity.  This happened on January 6 and June 30, 2003 and a neuro consult was called.   An MRI/MRA was recommended and the MRI was performed and it showed age-  related atrophy and chronic small vessel disease without evidence of acute  or reversible process.  The neurology opinion felt that she most likely had  possible brainstem-related etiology of the apnea and therefore, no further  evaluation is indicated at  this time.  She had some problems with agitation  (she was not combative) and this was controlled with p.r.n. medications.   She has a history of iron-deficiency anemia and her iron tablet was  increased to b.i.d.  Her beta blocker dosage was adjusted to better control  her blood pressure as well.  She was not able to cooperate with cardiac  rehab.  A lipid profile was done and it showed a total cholesterol of 256  with triglycerides 69, HDL 75, and LDL 167.  She had statin added to her  medication regimen and reported no side effects with it.  Additionally she  had some hyperglycemia upon admission and hemoglobin A1C was checked and it  was 6.1.  It was felt that her diet needed to limit processed sugars but  otherwise, could resume previous diet.  [name] was considered stable for  discharge back to [facility] on July 03, 2003.  Iron profile  was pending at the time of dictation.   LABORATORY VALUES:  Hemoglobin 9.1, hematocrit 28.3, WBC 8.8, platelets 245.  Sodium 139, potassium 4.1, chloride 108, CO2 26, BUN 13, creatinine 0.6,  glucose 107.  Hemoglobin A1C 6.1.   Chest x-ray no evidence of active disease.  There is again  seen prominence  of the hilar and basilar bronchovascular markings with increased density in  the right upper lung field which has not changed significantly since the  previous stay and probably represents scarring.   DISCHARGE CONDITION:  Improved.   DISCHARGE DIAGNOSES:  1. Acute anterior myocardial infarction with percutaneous transluminal     coronary angioplasty and stent x2 to the left anterior descending.  2. Left ventricular dysfunction with an ejection fraction of 40% at     catheterization, 30% by echocardiogram this admission, repeat     echocardiogram in 4-6 weeks.  3. Hypertension.  4. Hyperlipidemia.  5. Dementia.  6. Syncope.  7. Iron-deficiency anemia.   DISCHARGE INSTRUCTIONS:  1. Her activity level is to include no strenuous activity until cleared by     MD.  2. She is to stick to a diet that is low in processed sugars and low fat.  3. She is to follow up with cardiology and an appointment will be arranged     prior to transfer.  4. She is to follow up with Dr. Leanord Hawking as needed or as scheduled.   DISCHARGE MEDICATIONS:  1. Iron 325 mg p.o. b.i.d.  2. Remeron 15 mg at bedtime.  3. Protonix 40 mg once daily while on Plavix.  4. Lipitor 40 mg once daily.  5. Aspirin 325 mg once daily.  6. Plavix 75 mg once daily.  7. Coreg 12.5 mg p.o. b.i.d.  8. Zyprexa 2.5 mg p.o. at bedtime.      Theodore Demark, P.A. [facility]                  Thomas D. Riley Kill, M.D.   RB/MEDQ  D:  07/03/2003  T:  07/03/2003  Job:  [id number]   cc:   [name], M.D.   Dr. Leanord Hawking

## 2010-11-08 NOTE — H&P (Signed)
NAME:  [name] [name], [name] [name] NO.:  [id number]   MEDICAL RECORD NO.:  [id number]          PATIENT TYPE:  EMS   LOCATION:  MAJO                         FACILITY:  [facility]   PHYSICIAN:  Michaelyn Barter, M.D. DATE OF BIRTH:  1922-05-29   DATE OF ADMISSION:  03/03/2005  DATE OF DISCHARGE:                                HISTORY & PHYSICAL   PRIMARY CARE PHYSICIAN:  Maxwell Caul, M.D.   CHIEF COMPLAINT:  Unresponsiveness.   HISTORY OF PRESENT ILLNESS:  [name] [name] is an 77 year old female with a past  medical history of anterior myocardial infarction, hypertension, and  advanced dementia.  She currently cannot provide a history, and her  daughter, [name] [name], is at her bedside, and she is the individual who  gives the history.  According to [name] [name], the patient resides at  [facility].  She was called at work around 2:30 p.m. today and  told that the patient was unresponsive.  The patient had been complaining  that she was not feeling well this morning.  She usually can feed herself;  however, this morning she could not do that, and she required assistance.  The nursing facility told [name] [name] that her mother's blood pressure had  dropped.  The patient's primary care physician was called secondary to this  finding, and he instructed the facility to transfer the patient to the  hospital for further evaluation.  It was also noted that the patient fell  approximately 2 weeks ago, and then again last Monday.  The patient's  daughter went on to state that the patient's health and mental status have  both been declining for quite some time.   PAST MEDICAL HISTORY:  1.  Acute anterior myocardial infarction with percutaneous transluminal      coronary angioplasty and stent x2 to the left anterior descending.  2.  Left ventricular dysfunction.  3.  Hypertension.  4.  Hyperlipidemia.  5.  Dementia.  6.  Syncope.  7.  Iron-deficiency anemia.   ALLERGIES:  No known drug allergies.   SOCIAL HISTORY:  Cigarettes - denies.  Alcohol - denies.   FAMILY HISTORY:  Mother had hypertension.  Father had hypertension.   MEDICATIONS:  1.  Plavix 75 mg one tablet once a day.  2.  Lisinopril 2.5 mg once a day.  3.  Lipitor 40 mg once a day.  4.  Ativan 0.5 mg by mouth twice a day.  5.  Namenda 10 mg twice a day.  6.  Coreg 6.25 mg by mouth b.i.d.  7.  Protonix 40 mg daily.  8.  Trinsicon capsules, one capsule by mouth daily.  9.  Zyrtec 2 mg one tablet by mouth daily.  10. Aricept 10 mg one tablet by mouth at bedtime.   REVIEW OF SYSTEMS:  The patient cannot provide.   PHYSICAL EXAMINATION:  GENERAL:  The patient is awake.  She looks very  lethargic.  Her eyes are open.  She responds inappropriately to questions  sometimes; other times she just does not respond.  She does no recognize who  her children are,  as they are standing currently at her bedside.  VITAL SIGNS:  Temperature 97.1, blood pressure 78/32, heart rate 58,  respirations 20, O2 saturation 95%.  HEENT:  Anicteric.  Pupils are constricted bilaterally.  NECK:  Supple.  No JVD.  Thyroid is not palpable.  No lymphadenopathy.  HEART:  Distant heart sounds.  RESPIRATORY:  Decreased breath sounds bilaterally.  ABDOMEN:  Soft, nontender, nondistended.  Decreased bowel sounds.  EXTREMITIES:  No edema.  NEUROLOGIC:  The patient is confused to person, place, and time.  MUSCULOSKELETAL:  There was 3/5 upper extremity strength, 0/5 bilateral leg  strength.   LABORATORY DATA:  pH of 7.329, PCO2 of 34.4, bicarbonate 18.1.  White blood  cell count 10.2, hemoglobin 9.6, hematocrit 30.0, platelets 123.  Sodium  149, potassium 4.7, chloride 118, BUN 139, creatinine 5.2, glucose 135.  CK-  MB, PLC 4.4, troponin I, PLC less than 0.05.  Myoglobin, PLC greater than  500.  Nitrites negative.  Leukocytes negative.   ASSESSMENT AND PLAN:  1.  Episode of unresponsiveness, the etiology of  which is questionable.  The      differential includes severe dehydration versus infection versus      metabolic process versus an intracranial process.  Will check a CT of      the head without contrast to rule out intracranial process.  Will check      a B-12, RPR, TSH, and T4.  2.  Renal failure.  The etiology of this is most likely to be prerenal (i.e.      secondary to significant dehydration and volume depletion).  Will      supplement with aggressive IV hydration for now.  Will check a BUN and      creatinine in the a.m.  3.  Hypovolemic shock.  Will again support with IV fluids for now, and will      consider dopamine if this is inadequate to sustain blood pressure.  4.  Thrombocytopenia.  The etiology is questionable.  Will monitor for now.      Will consider transfusion if there are any overt signs of bleeding.  5.  Advanced dementia.  Will monitor.  6.  History of myocardial infarction.  No current complaints; however, will      check troponin I plus CK-MB.  7.  GI prophylaxis.  Will provide Protonix 40 mg daily.      Michaelyn Barter, M.D.  Electronically Signed     OR/MEDQ  D:  03/03/2005  T:  03/03/2005  Job:  [id number]   cc:   Maxwell Caul, M.D.  [address]  [place]  [place] [address]  Fax: [phone]

## 2010-11-08 NOTE — H&P (Signed)
[place]  Patient:    [name] [name], [name] [name]                      MRN: [id number] Adm. Date:  [id number] Attending:  Georgann Housekeeper                         History and Physical  CHIEF COMPLAINT: Patient having hallucinations and delusions and weakness.  HISTORY OF PRESENT ILLNESS: This 77 year old African-American female, patient of [facility], was brought in by her daughter complaining of a four to five day history of the patients having hallucinations and delusions.  The patients daughter reports that Mom has been seeing alligators in the home (also two or three days ago saw a bald man coming into the house), talking to a boy when there was nobody outside.  The patient lives with her daughter. She had intermittent dizziness and occasional headaches.  REVIEW OF SYSTEMS: The daughter states she has had poor p.o. intake for quite some time and frequent urination without any blood in the urine.  No nausea, vomiting.  No diarrhea.  No fever.  No chest pain, no shortness of breath, no cough.  No prior history of delusions.  PAST MEDICAL HISTORY:  1. History of hypertension.  She was on medication up until a year ago and     she stopped taking it and never went back to [facility] to get the     medication.  2. Also, she reports she had a small stroke about two years ago when she     presented to the ER.  2. Also, question of depression.  MEDICATIONS: Meclizine for dizziness, which she takes occasionally.  ALLERGIES: None.  SOCIAL HISTORY: She lives with her daughter.  She is single.  No tobacco, no alcohol.  Uses a walker at home to get around.  PAST SURGICAL HISTORY: Hysterectomy.  FAMILY HISTORY: Positive for hypertension and stroke in mother.  No mental illness or diabetes.  PHYSICAL EXAMINATION:  VITAL SIGNS: On initial presentation blood pressure was 212/105 and on my examination was 190/90.  Pulse 87.  Temperature 97 degrees.   Respirations 18. Oxygen saturation 95% on room air.  GENERAL: Elderly, mildly cachectic female in no acute distress.  Alert and oriented x 2.  She knew she was at [facility] and she knew the year and the daughter.  HEENT: Sclerae nonicteric.  PERRL.  Mucous membranes moist.  NECK: Supple.  No thyromegaly or carotid bruits.  LUNGS: Clear.  CARDIAC: Regular, S1 and S2, with 1/6 systolic murmur.  ABDOMEN: Soft, positive bowel sounds, without masses or bruits.  Nontender.  EXTREMITIES: Trace edema of left ankle.  According to the daughter, she has chronic trace ankle edema.  NEUROLOGIC: Alert and oriented x 2.  Cranial nerves 2-12 grossly intact. Lower extremity strength 4+/5, upper extremity 5/5.  Reflexes 1+ and symmetric in lower extremities at the knee, in the ankle.  Right toe upgoing, left equivocal.  LABORATORY DATA: UA negative except for mild protein.  WBC 5.1, hemoglobin 10.7, platelets 241,000.  Electrolytes, sodium 142, potassium 2.3, BUN 9, creatinine 0.7, glucose 80, bicarbonate 30.  LFTs normal.  Albumin low at 3.5, total protein 6.9.  EKG showed normal sinus rhythm with LVH, nonspecific T wave changes.  Head CT and chest x-ray pending.  IMPRESSION: The patient is a 77 year old female, a patient of [facility], with history of hypertension and remote history of  cerebrovascular accident, presenting with a week history of hallucinations and delusions, hyperkalemia, and hypertensive.  PLAN: Admit with replacement of potassium.  Check head CT.  For hypertension I would start on Enalapril and watch blood pressure.  Given history of hypertension and heart murmur will check an echocardiogram.  As far as her mild anemia with low MCV, will check iron studies.  No evidence of any active bleed.  Get PT and nutrition involved. DD:  01/21/01 TD:  01/22/01 Job: [id number]

## 2010-11-08 NOTE — Discharge Summary (Signed)
[name] [name], [name] [name]                         ACCOUNT NO.:  [id number]   MEDICAL RECORD NO.:  [id number]                   PATIENT TYPE:  INP   LOCATION:  0484                                 FACILITY:  [name]   PHYSICIAN:  [name], M.D.         DATE OF BIRTH:  [birth year]   DATE OF ADMISSION:  07/07/2003  DATE OF DISCHARGE:  07/10/2003                                 DISCHARGE SUMMARY   CHIEF COMPLAINT:  Transfusable anemia.   DISCHARGE DIAGNOSES:  1. Multifactorial anemia, transfusable on admission, thought secondary to     iron deficiency and anemia of chronic disease.     a. No signs of acute bleed, and stools occult blood-negative.     b. Transfusion two units packed red blood cells.     c. Discharge hemoglobin 11.1.  2. Dementia with nocturnal agitation.  3. Uncontrolled hypertension, stable status post increase Coreg, cardiology.     a. Cardiology consult, Dr. [name].     b. Recent acute anterior myocardial infarction with angioplasty done        January 5 and stents x2 to the left anterior descending.     c. Per cardiac catheterization, left ventricular dysfunction with        ejection fraction of 40%.     d. Echocardiogram done January 5 noted ejection fraction 30% with planned        follow-up echocardiogram in four to six weeks.     e. Hyperlipidemia.  4. Hypokalemia secondary to Lasix administration with blood transfusion.     a. Status post K-Dur administration, discharge potassium 4.4.  5. History of syncope per cardiology felt noncardiac in origin.     a. EEG done July 04, 2003, noted study within normal limits with no        definit epileptiform activity identified.     b. MRI of the brain on July 01, 2003, noted age-related atrophy and        chronic small-vessel disease without evidence of acute or reversible        process.     c. History of positive RPR with no history of syphilis known.  6. History of hallucinations, delusions.  7. Listed  history of depression.  8. Listed history of respiratory arrest January 26, 2001, with negative     cardiac workup, negative CT scan of the brain, unclear etiology,     requiring Ambu bag ventilation, and accompanying hypotension.  9. Allergies listed:  No known drug allergies.  10.      Code status:  Patient appears to currently be a full code.  Would     make discussing code status with the family post discharge at the nursing     home a high priority.   DISCHARGE MEDICATIONS:  1. Remeron 15 mg daily at bedtime.  2. Zyprexa 2.5 mg daily at bedtime.  3. Lipitor 40 mg once daily.  4. Plavix 75 mg once  daily.  5. Protonix 40 mg once daily.  6. Ferrous sulfate 325 mg three times daily.  7. Coreg 25 mg twice daily.  8. Tylenol 650 mg every four hours as needed for mild pain or fever.  9. Ativan 0.5 mg every eight hours as needed for severe agitation.  10.      Senokot S two tablets daily at bedtime as needed for constipation.   SPECIAL DISCHARGE INSTRUCTIONS:  1. Disposition:  Patient returning to [name], primary care     [name].  2. Activity:  Up as tolerated per physical and occupational therapy     supervision.  3. Discharge diet:  Low-fat, low-cholesterol, low-sodium.  4. Follow-up labs:  A CBC and basic metabolic panel one to two weeks post     discharge.  5. Foley catheter recently discontinued.  Monitor each shift for any signs     or symptoms of urinary retention and notify M.D. if noted for 48 hours     post discharge.   CONSULTATIONS OVER THE COURSE OF HOSPITALIZATION:  [name], M.D.,  cardiology, July 08, 2003.   PROCEDURES OVER THE COURSE OF HOSPITALIZATION:  1. Transfusion, two units of packed red blood cells.   DISCHARGE LABORATORY DATA:  Hemoglobin and hematocrit done July 10, 2003,  11.1 and 33.4, respectively.  Follow-up serum potassium level July 10, 2003, was 4.4.  A basic metabolic panel done July 09, 2003, sodium  137,  potassium 3.5, chloride 106, CO2 27, BUN 20, and creatinine 0.8.  Sedimentation rate elevated at 69.  Serum erythropoietin level is still  listed as pending.  Would follow this up post discharge to evaluate for the  need for Procrit.  No radiology obtained over the course of this  hospitalization.   HISTORY OF PRESENT ILLNESS:  This 77 year old female resident of [facility] is followed in primary care by [name].  She had a  recent hospitalization through July 03, 2003, for an acute anterior MI.  She had PTCA and stent x2 to left anterior descending artery.  Hemoglobin at  that discharge was 9.1.  Follow-up labs at the nursing home facility found  hemoglobin transfusable at 7.9.  The patient was on iron supplementation  twice daily.  She was also on aspirin and Plavix.  Stool was checked in the  nursing home facility, and this was occult blood-negative.  She was admitted  for further evaluation and transfusion.  For admission medications,  laboratory data, physical exam, impression and plan, please see admission  history and physical note dated July 07, 2003.   HOSPITAL COURSE:  Problem 1.  MULTIFACTORIAL ANEMIA:  Again, the patient presented with  transfusable anemia, hemoglobin below 8.0.  Per admission labs in the  hospital, hemoglobin was actually 8.3.  She was transfused two units of  packed red blood cells.  Post-transfusion hemoglobin climbed to 10.2 and by  discharge settled at a level of 11.1.  Stool was checked for occult blood  and this was negative.  Anemia indices were checked during her  hospitalization earlier in January, and they did suggest a component of iron  deficiency with iron low at 20, binding capacity low at 194, iron percent  saturation 10.  Ferritin was 294.  There is also likely a component of  chronic disease here.  A serum erythropoietin level was checked July 07, 2003, and these results are still pending.  Would  follow for these post  discharge  to see if there is any need for Procrit post discharge to avoid  the need for further transfusions.  Would recheck a CBC one to two weeks  post discharge.   Problem 2.  DEMENTIA WITH NOCTURNAL AGITATION:  The patient did have some  agitation post discharge, mainly in the evenings.  Haldol p.r.n. was  utilized and at discharge the patient is quite pleasant.   Problem 3.  UNCONTROLLED HYPERTENSION:  The patient recently hospitalization  under cardiology service for an acute anterior MI.  Dr. [name] consulted over  the course of this hospitalization.  Noted uncontrolled hypertension.  Recommended increasing Coreg to 25 mg twice daily.  Post increased Coreg,  blood pressures have been stable.   Problem 4.  RECENT ACUTE MYOCARDIAL INFARCTION WITH LIKELY COMPONENT OF  ISCHEMIC CARDIOMYOPATHY:  For full details of that hospitalization, see the  cardiology discharge from July 03, 2003.  The patient had no indication  of angina over the course of this hospitalization.   Problem 5.  HYPOKALEMIA:  Post transfusion packed red blood cells, Lasix was  administered between units.  A serum potassium low normal at 3.5 was  supplemented.  Post-transfusion potassium 4.4.  Would follow a basic  metabolic panel in one two weeks.   Problem 6.  HISTORY OF SYNCOPE:  The patient did have an EEG study and that  was negative for epileptiform activity.  Also an MRI of the brain, and that  was negative for any acute findings.  Cardiology's impression is this is  noncardiac in etiology.  The exact etiology is unclear.   Problem 7.  CODE STATUS:  Currently full code.  Would discuss this with  family post discharge considering the patient's multiple medical problems.   DISPOSITION:  Discharged back to [facility], primary care  [facility].  Activity:  Up as tolerated per supervision of  physical and occupational therapy.   CONDITION ON DISCHARGE:   Stable/improved, though frail with multiple medical  problems as above.   Discharge process greater than 30 minutes, 8:15 a.m. through 9 a.m.     Everett Graff, N.P.                     [name], M.D.    TC/MEDQ  D:  07/10/2003  T:  07/10/2003  Job:  [id number]   cc:   [name], M.D.

## 2010-11-08 NOTE — Consult Note (Signed)
[name] [name], [name] [name] NO.:  [id number]   MEDICAL RECORD NO.:  [id number]                   PATIENT TYPE:  INP   LOCATION:  0484                                 FACILITY:  [facility]   PHYSICIAN:  [name], M.D.                   DATE OF BIRTH:  [birth year]   DATE OF CONSULTATION:  07/08/2003  DATE OF DISCHARGE:                                   CONSULTATION   HISTORY:  [name] [name] is a 77 year old black female who was admitted to [facility] with anemia.  The patient has a history of coronary artery  disease and suffered an anterior wall myocardial infarction on June 27, 2003, treated with emergent stent placement to the LAD.  She had mild to  moderate LV dysfunction by catheterization and subsequent echocardiogram.  She had brief non-sustained ventricular tachycardia and was felt to be  reperfusion dysrhythmia, no further dysrhythmias were seen during  hospitalization.  She did have episodes of syncope that were felt to be  central apnea by neurology.  MRI and MRA showed age related atrophy with  small vessel disease.  She has had a history of anemia in the past, and has  been defined as iron-deficiency, but her blood counts have been stable  during hospitalization.  The patient was admitted to [facility] on  July 07, 2003, with complaints of weakness, found to have a drop in  hemoglobin to 7.9 mg/dl.  Upon discharge from [facility], it was at 9.1.  The  patient has not had any bright red blood per rectum, melena, hematuria, or  dysuria.  She denies any chest pain, severe shortness of breath.  She notes  improvement in weakness since receiving transfusion of packed red blood  cells.   PAST MEDICAL HISTORY:  1. Hypertension.  2. Mild dementia.  3. History of syncope.  4. Coronary artery disease, status post stent placement to the LAD on     June 27, 2003.   ALLERGIES:  None.   MEDICATIONS:  1. Iron 325 mg b.i.d.  2. Remeron 15  mg q.h.s.  3. Protonix 40 mg daily.  4. Lipitor 40 mg daily.  5. Aspirin 325 mg daily.  6. Plavix 75 mg daily.  7. Coreg 12.5 mg b.i.d.  8. Zyprexa 25 mg b.i.d.   SOCIAL HISTORY:  The patient is widowed, two siblings, two daughters.  Lives  at [place]. She has a history of tobacco use.   PHYSICAL EXAMINATION:  GENERAL:  A well-developed, well-nourished frail  female in no acute distress.  VITAL SIGNS:  Temperature 99.7, blood pressure 120/49, pulse 76,  respirations 18.  HEENT:  Pupils react to light.  Extraocular muscles are intact.  Oropharynx  shows poor dentition.  NECK:  Supple, no adenopathy or bruits.  HEART:  Regular rate without murmurs.  LUNGS:  Clear to auscultation.  ABDOMEN:  Protuberant, but  nontender.  Groins are stable bilaterally.  There  is no flank tenderness.   LABORATORY DATA:  ECG on admission shows sinus rhythm at 75 with T-wave  inversion in the anterior lead consistent with an evolving anterior wall  myocardial infarction.  The patient's white count is 6.8, hemoglobin 7.9,  hematocrit 24.8, platelets 283.  Sodium is 146, potassium 3.9, chloride 111,  bicarbonate 31, BUN 28, creatinine 0.8, glucose 91.  Repeat hemoglobin after  2 units of packed red blood cells was 10.2.   ASSESSMENT AND PLAN:  [name] [name] is a 77 year old female with a history of  coronary artery disease, status post recent anterior wall myocardial  infarction, stent placed to the left anterior descending, who presents now  with anemia and unclear source of blood loss.  She is felt to have had  chronic iron-deficiency anemia in the past with negative gastrointestinal  workup.  Appropriate studies are underway, and repeat gastrointestinal  workup may be necessary including endoscopy.  Symptomatically, she is  improved after transfusion.  There is no clear signs of ongoing blood loss  or bleeding.  We will continue her aspirin and Plavix.  Her syncope in the  past was felt to be  essentially mediated.  There is no specific treatment  was indicated by neurology, and she has not had any episodes recently.  Again, consider advancing her carvedilol to 25 mg b.i.d. to control  hemodynamics.  Otherwise, we will continue her current medications and  follow her expectantly.                                               [name], M.D.    NG/MEDQ  D:  07/08/2003  T:  07/08/2003  Job:  [id number]

## 2010-11-08 NOTE — Discharge Summary (Signed)
[name] [name], [name] [name]             ACCOUNT NO.:  [id number]   MEDICAL RECORD NO.:  [id number]          PATIENT TYPE:  INP   LOCATION:  4709                         FACILITY:  [facility]   PHYSICIAN:  Isidor Holts, M.D.  DATE OF BIRTH:  09-Oct-1921   DATE OF ADMISSION:  03/03/2005  DATE OF DISCHARGE:                                 DISCHARGE SUMMARY   DISCHARGE DIAGNOSES:  1.  Altered mental status secondary to dehydration.  2.  Acute renal failure, pre-renal, secondary to dehydration.  3.  Hyponatremia.  4.  History of hypertension.  5.  History of coronary artery disease.  6.  Chronic iron deficiency anemia.  7.  Dyslipidemia.  8.  Positive RPR/TPPA, negative cerebrospinal fluid, ? latent syphilis.   DISCHARGE MEDICATIONS:  1.  Lipitor 40mg  p.o. daily.  2.  Zyrtec 10 mg p.o. daily.  3.  Protonix 40 mg p.o. daily.  4.  Aricept 10 mg p.o. daily.  5.  Nu-Iron 150 mg p.o. daily.  6.  Haldol 0.5 mg p.o. p.r.n. t.i.d.  7.  Ativan 0.5 mg p.o. p.r.n. b.i.d.  8.  Plavix 75 mg p.o. daily.  9.  Benzathine penicillin G 2,400,000 units IM on March 18, 2005 and on      March 25, 2005.  10. Lisinopril, Namenda, Coreg, Trinsicon all discontinued.   PROCEDURES:  1.  Head CT scan dated March 03, 2005. This showed no acute intracranial      findings, stable cerebral atrophy and chronic microvascular white matter      disease, stable chronic right mastoiditis.  2.  Fluoroscopy-guided lumbar puncture dated March 09, 2005. This showed      an opening pressure of 7 cm of H20, 6 mL clear pink fluid was obtained.   CONSULTATIONS:  Dr. Johny Sax, infectious diseases.   ADMISSION HISTORY:  As in H&P note of March 03, 2005. However, in brief,  this is an 77 year old female with known history of coronary artery disease  status post MI, status post PTCA and stent; also hypertension, dyslipidemia,  dementia, and chronic iron deficiency anemia, resident of [facility], who presents with altered mental status. She was admitted for further  evaluation, investigation, and management.   CLINICAL COURSE:  #1 - ALTERED MENTAL STATUS. At the time of initial  evaluation, the patient was found to have a normal temperature, but blood  pressure was low at 78/32 with a heart rate of 58. Cardiac enzymes were  unelevated. BMP showed a sodium level of 149, potassium 4.7, chloride 118,  BUN 139, creatinine 5.2. Obviously, the patient was profoundly volume-  depleted and hypotensive. She was therefore managed with aggressive  intravenous fluid hydration, and antihypertensive medication including  lisinopril and Coreg were discontinued. Head CT scan dated March 03, 2005 was negative for acute findings, showing only chronic white matter  disease, as well as cerebral atrophy. She was admitted initially to the  intensive care unit, was briefly on dopamine. However, she responded to  above-mentioned treatment measures, and as of March 06, 2005 had become  once again alert and responsive and was  following simple commands. Blood  pressure had become more reasonable at 105/40 and renal indices showed a BUN  of 40 with a creatinine of 1.6. She was therefore transferred to general  medical floor. Intravenous fluid hydration was continued and by March 07, 2005 serum sodium levels had normalized at 145. These levels have  remained normal ever since, and as a matter of fact, on March 11, 2005  serum sodium is 141. Serum creatinine as of March 11, 2005 is now 1.2.  The patient's mental status appears to have returned to baseline.   #2 - HISTORY OF HYPERTENSION. See #1 above. The patient remained  normotensive throughout the course of her hospital stay, off her pre-  admission antihypertensives. As a matter of fact, on March 11, 2005 her  blood pressure was entirely reasonable at 110/58 mmHg.   #3 - HISTORY OF IRON DEFICIENCY ANEMIA. The patient is a  well-known case of  chronic iron deficiency anemia. It is not clear what workup she has had in  the past. However, on March 06, 2005 a hemoglobin was 8.6 with a  hematocrit of 26.6. Hematinic studies were done. She was transfused with 2  units of PRBCs resulting in a posttransfusion hemoglobin of 10.3 on  March 07, 2005. Iron studies show an iron of 38 with ferritin of 662 and  folate of 19.7, vitamin B12 of 1212. The patient is iron deficient and has  been commenced on iron supplement accordingly.   #4 - HISTORY OF DEMENTIA. This did not prove problematic during the course  of the patient's hospital stay. However, the patient did require p.r.n.  Haldol for agitation.   #5 - HISTORY OF CORONARY ARTERY DISEASE. There were no symptoms referable to  this throughout the course of the patient's hospital stay.   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 The patient, on routine laboratory evaluation  for altered mental status was found to have an RPR which was positive in a  titer 1:2. She also had a positive TPPA. The health department was  contacted, telephone number [phone]. Discussion was held with Georgena Spurling who confirmed that the patient had been noted to have a positive RPR  in a titer of 1:2 in 2002 but has never had specific treatment. Infectious  disease consultation was called which was kindly provided by Dr. Johny Sax who counseled, that against a background of altered mental status an  LP was mandatory. This was carried out on March 09, 2005 and showed the  following findings:  CSF clear, colorless; RBC 645; rare lymphocytes;  glucose 67; protein 64; gram stain and culture negative; RPR non-reactive.  Against the background of these results, infectious disease specialist, felt  that the patient had to be treated for possible latent syphilis and  recommended benzathine penicillin G 2.4 million units weekly x3 doses IM. The first dose has been administered on  March 11, 2005. Subsequent doses  will be given on March 18, 2005 and on March 25, 2005 in the nursing  home environment.   DISPOSITION:  The patient is discharged in satisfactory condition on  March 11, 2005 to the nursing home. She is to follow up routinely with  Dr. Leanord Hawking, her PMD.   ACTIVITY:  As tolerated.   DIET:  Healthy heart.   WOUND CARE:  Not applicable.      Isidor Holts, M.D.  Electronically Signed     CO/MEDQ  D:  03/11/2005  T:  03/11/2005  Job:  [id number]   cc:   Maxwell Caul, M.D.  [address]  [place]  [place] [address]  Fax: [phone]

## 2010-11-08 NOTE — Consult Note (Signed)
[name], [name]                         ACCOUNT NO.:  [id number]   MEDICAL RECORD NO.:  [id number]                   PATIENT TYPE:  INP   LOCATION:  2930                                 FACILITY:  [facility]   PHYSICIAN:  [name] P. Pearlean Brownie, MD                 DATE OF BIRTH:  June 12, 1922   DATE OF CONSULTATION:  DATE OF DISCHARGE:                                   CONSULTATION   REFERRING PHYSICIAN:  Arturo Morton. Riley [name], M.D.   HISTORY OF PRESENT ILLNESS:  [name] [name] is a 77 year old lady who had a  witnessed episode of transient respiratory depression followed by altered  consciousness for several minutes.  The patient is unable to provide  history, which was obtained from her daughter, who was present at the  bedside.  The patient apparently had recurrent stereotypical episodes over  the last three months.  Three of them occurred previously at the nursing  home and all of them were found while she was sitting in a chair.  She had  one episode yesterday, as well as one today.  The episode witnessed today by  the daughter was described as that the patient was sitting up on a chair and  the daughter was speaking to her.  She started sleeping.  The daughter  managed to wake her up.  She answered a few questions and again drifted back  to sleep.  This happened a few times over a few minutes followed by her  become completely unresponsive.  At this point, she was laid flat in the  bed.  She was found to have a heart rate in the 80s and blood pressure in  the 90s.  After being given a fluid bolus, the blood pressure came up to  about 100, but the patient remained unresponsive for about 10-15 minutes.  After that, she gradually started moaning and became more responsive.  Speech initially was not coherent, but gradually she started making sense.  She was found to move all four extremities equally.  She seems to have  returned almost back to her baseline by now.  She has no history of focal  extremity tonicoclonic activity or paralysis.  She has had a history of TIAs  in the remote past as per the daughter, but had no lasting deficits.  She  has a history of moderate Alzheimer's dementia for which she has been in a  nursing home.  She is able to recognize family members and communicate  effectively.  She has poor memory.  She needs some assistance to walk and  uses a walker.  She can feed herself and helps with dressing.   PAST MEDICAL HISTORY:  Significant for:  1. Hypertension.  2. Dementia.  3. Syncope.  4. Iron deficiency anemia.   PAST SURGICAL HISTORY:  None.   MEDICATION ALLERGIES:  None.   HOME MEDICATIONS:  Iron, Zyprexa, and Remeron.  SOCIAL HISTORY:  She is widowed and lives in a nursing home at [place].  She used to smoke in the past most of her adult life.  She does not drink  alcohol.   FAMILY HISTORY:  Noncontributory.   PHYSICAL EXAMINATION:  GENERAL APPEARANCE:  A frail elderly lady who is not  in distress.  VITAL SIGNS:  She is afebrile.  The respiratory rate is 20 per minute.  The  blood pressure is 110/80.  Distal pulses are well felt.  HEENT:  The head is atraumatic.  The ENT exam is unremarkable.  NECK:  Supple without bruit.  CARDIAC:  Regular heart sounds.  LUNGS:  Clear to auscultation.  NEUROLOGIC:  She is awake and alert.  She is oriented to person and place,  but not to time.  She follows simple one and two-step commands only.  She  has diminished recall and memory.  There is no aphasia or apraxia.  Speech  is slurred.  I can barely understand her, but her daughter is able to  understand her quite well.  Eye movements are full range without nystagmus.  She does respond to visual threat on either side equally.  Visual acuity  cannot be assessed accurately.  The face is symmetric.  The palate elevates  normally.  The tongue is midline.  The motor system exam reveals symmetric  upper and lower extremity strength.  The lower extremity  exam is limited due  to pain in the knees.  The deep tendon reflexes are symmetric.  Plantar  elicitation is limited due to pain.  Sensation and coordination cannot be  reliably tested.  Gait was not tested.   LABORATORY DATA:  Data reviewed.  No recent brain imaging studies are  available.  An EEG has been done at the bedside and the visualized portion  by me does not show any obvious epileptiform features.   IMPRESSION:  A 77 year old lady with dementia with five recurrent  stereotypical episodes of respiratory depression followed by transient loss  of consciousness.  These episodes likely represent central sleep apnea  episodes versus vertebrobasilar transient ischemic attacks with compromised  respiratory status.   PLAN:  I would recommend trying BIPAP mask, titrate to patient comfort, and  to keep saturations above 92%.  MRI scan of the brain with MRA of the brain  and neck to image her posterior circulation.  Kindly call for questions.  Rehabilitation to follow her in consult.  Continue aspirin and Plavix for  stroke prevention.                                               [name] P. Pearlean Brownie, MD    PPS/MEDQ  D:  06/30/2003  T:  07/01/2003  Job:  [id number]

## 2011-02-17 ENCOUNTER — Other Ambulatory Visit: Payer: Self-pay | Admitting: *Deleted

## 2012-09-16 ENCOUNTER — Inpatient Hospital Stay (HOSPITAL_COMMUNITY)
Admission: EM | Admit: 2012-09-16 | Discharge: 2012-09-18 | DRG: 640 | Disposition: A | Discharge status: Skilled Nursing Facility | Payer: Medicare Other | Attending: Internal Medicine | Admitting: Internal Medicine

## 2012-09-16 ENCOUNTER — Emergency Department (HOSPITAL_COMMUNITY): Payer: Medicare Other

## 2012-09-16 ENCOUNTER — Encounter (HOSPITAL_COMMUNITY): Payer: Self-pay | Admitting: Emergency Medicine

## 2012-09-16 DIAGNOSIS — H9190 Unspecified hearing loss, unspecified ear: Secondary | ICD-10-CM | POA: Diagnosis present

## 2012-09-16 DIAGNOSIS — K219 Gastro-esophageal reflux disease without esophagitis: Secondary | ICD-10-CM | POA: Diagnosis present

## 2012-09-16 DIAGNOSIS — R4182 Altered mental status, unspecified: Secondary | ICD-10-CM

## 2012-09-16 DIAGNOSIS — F0390 Unspecified dementia without behavioral disturbance: Secondary | ICD-10-CM

## 2012-09-16 DIAGNOSIS — I252 Old myocardial infarction: Secondary | ICD-10-CM

## 2012-09-16 DIAGNOSIS — E870 Hyperosmolality and hypernatremia: Secondary | ICD-10-CM

## 2012-09-16 DIAGNOSIS — I2510 Atherosclerotic heart disease of native coronary artery without angina pectoris: Secondary | ICD-10-CM | POA: Diagnosis present

## 2012-09-16 DIAGNOSIS — N390 Urinary tract infection, site not specified: Secondary | ICD-10-CM | POA: Diagnosis present

## 2012-09-16 DIAGNOSIS — G92 Toxic encephalopathy: Secondary | ICD-10-CM | POA: Diagnosis present

## 2012-09-16 DIAGNOSIS — Z79899 Other long term (current) drug therapy: Secondary | ICD-10-CM

## 2012-09-16 DIAGNOSIS — M810 Age-related osteoporosis without current pathological fracture: Secondary | ICD-10-CM | POA: Diagnosis present

## 2012-09-16 DIAGNOSIS — I10 Essential (primary) hypertension: Secondary | ICD-10-CM | POA: Diagnosis present

## 2012-09-16 DIAGNOSIS — Z7902 Long term (current) use of antithrombotics/antiplatelets: Secondary | ICD-10-CM

## 2012-09-16 DIAGNOSIS — G929 Unspecified toxic encephalopathy: Secondary | ICD-10-CM | POA: Diagnosis present

## 2012-09-16 DIAGNOSIS — G309 Alzheimer's disease, unspecified: Secondary | ICD-10-CM | POA: Diagnosis present

## 2012-09-16 DIAGNOSIS — F0280 Dementia in other diseases classified elsewhere without behavioral disturbance: Secondary | ICD-10-CM | POA: Diagnosis present

## 2012-09-16 HISTORY — DX: Unspecified protein-calorie malnutrition: E46

## 2012-09-16 HISTORY — DX: Pemphigoid, unspecified: L12.9

## 2012-09-16 HISTORY — DX: Age-related osteoporosis without current pathological fracture: M81.0

## 2012-09-16 HISTORY — DX: Edema, unspecified: R60.9

## 2012-09-16 HISTORY — DX: Unspecified dementia, unspecified severity, without behavioral disturbance, psychotic disturbance, mood disturbance, and anxiety: F03.90

## 2012-09-16 HISTORY — DX: Atherosclerotic heart disease of native coronary artery without angina pectoris: I25.10

## 2012-09-16 HISTORY — DX: Gastro-esophageal reflux disease without esophagitis: K21.9

## 2012-09-16 HISTORY — DX: Old myocardial infarction: I25.2

## 2012-09-16 HISTORY — DX: Dementia in other diseases classified elsewhere, unspecified severity, without behavioral disturbance, psychotic disturbance, mood disturbance, and anxiety: F02.80

## 2012-09-16 HISTORY — DX: Alzheimer's disease, unspecified: G30.9

## 2012-09-16 LAB — PROTIME-INR: INR: 1.01 (ref 0.00–1.49)

## 2012-09-16 LAB — URINALYSIS, ROUTINE W REFLEX MICROSCOPIC
Nitrite: NEGATIVE
Specific Gravity, Urine: 1.022 (ref 1.005–1.030)
Urobilinogen, UA: 1 mg/dL (ref 0.0–1.0)
pH: 6.5 (ref 5.0–8.0)

## 2012-09-16 LAB — HEPATIC FUNCTION PANEL
ALT: 13 U/L (ref 0–35)
AST: 15 U/L (ref 0–37)
Albumin: 3.2 g/dL — ABNORMAL LOW (ref 3.5–5.2)
Bilirubin, Direct: <0.1 mg/dL (ref 0.0–0.3)
Total Protein: 7.5 g/dL (ref 6.0–8.3)

## 2012-09-16 LAB — BASIC METABOLIC PANEL
CO2: 24 mEq/L (ref 19–32)
Calcium: 9.8 mg/dL (ref 8.4–10.5)
Chloride: 118 mEq/L — ABNORMAL HIGH (ref 96–112)
Glucose, Bld: 111 mg/dL — ABNORMAL HIGH (ref 70–99)
Potassium: 4 mEq/L (ref 3.5–5.1)
Sodium: 154 mEq/L — ABNORMAL HIGH (ref 135–145)

## 2012-09-16 LAB — OSMOLALITY, URINE: Osmolality, Ur: 622 mOsm/kg (ref 390–1090)

## 2012-09-16 LAB — CBC WITH DIFFERENTIAL/PLATELET
Eosinophils Relative: 2 % (ref 0–5)
Lymphocytes Relative: 25 % (ref 12–46)
Lymphs Abs: 1.6 10*3/uL (ref 0.7–4.0)
MCV: 80.8 fL (ref 78.0–100.0)
Neutro Abs: 4.2 10*3/uL (ref 1.7–7.7)
Platelets: 226 10*3/uL (ref 150–400)
RBC: 4.99 MIL/uL (ref 3.87–5.11)
WBC: 6.5 10*3/uL (ref 4.0–10.5)

## 2012-09-16 LAB — URINE MICROSCOPIC-ADD ON

## 2012-09-16 LAB — APTT: aPTT: 29 seconds (ref 24–37)

## 2012-09-16 LAB — PHOSPHORUS: Phosphorus: 3.5 mg/dL (ref 2.3–4.6)

## 2012-09-16 LAB — MAGNESIUM: Magnesium: 2.5 mg/dL (ref 1.5–2.5)

## 2012-09-16 MED ORDER — ONDANSETRON HCL 4 MG/2ML IJ SOLN: 4.0000 mg | Freq: Three times a day (TID) | INTRAMUSCULAR | Status: AC | PRN

## 2012-09-16 MED ORDER — FAMOTIDINE 20 MG PO TABS
20.0000 mg | ORAL_TABLET | Freq: Every day | ORAL | Status: DC
  Administered 2012-09-18: 20 mg via ORAL
  Filled 2012-09-16 (×3): qty 1

## 2012-09-16 MED ORDER — DEXTROSE 5 % IV SOLN
1.0000 g | INTRAVENOUS | Status: DC
  Administered 2012-09-16 – 2012-09-17 (×2): 1 g via INTRAVENOUS
  Filled 2012-09-16 (×3): qty 10

## 2012-09-16 MED ORDER — DONEPEZIL HCL 10 MG PO TABS
10.0000 mg | ORAL_TABLET | Freq: Every day | ORAL | Status: DC
Start: 2012-09-16 — End: 2012-09-18
  Filled 2012-09-16 (×3): qty 1

## 2012-09-16 MED ORDER — CALCIUM CARBONATE 1250 (500 CA) MG PO TABS
1.0000 | ORAL_TABLET | Freq: Three times a day (TID) | ORAL | Status: DC
  Administered 2012-09-18: 500 mg via ORAL
  Filled 2012-09-16 (×8): qty 1

## 2012-09-16 MED ORDER — LACTULOSE 10 GM/15ML PO SOLN
30.0000 g | Freq: Every day | ORAL | Status: DC
  Filled 2012-09-16 (×3): qty 45

## 2012-09-16 MED ORDER — SODIUM CHLORIDE 0.9 % IV SOLN
INTRAVENOUS | Status: DC
  Administered 2012-09-16: 17:00:00 via INTRAVENOUS

## 2012-09-16 MED ORDER — HEPARIN SODIUM (PORCINE) 5000 UNIT/ML IJ SOLN
5000.0000 [IU] | Freq: Three times a day (TID) | INTRAMUSCULAR | Status: DC
  Administered 2012-09-16 – 2012-09-18 (×5): 5000 [IU] via SUBCUTANEOUS
  Filled 2012-09-16 (×9): qty 1

## 2012-09-16 MED ORDER — CLOPIDOGREL BISULFATE 75 MG PO TABS
75.0000 mg | ORAL_TABLET | Freq: Every day | ORAL | Status: DC
  Administered 2012-09-18: 75 mg via ORAL
  Filled 2012-09-16: qty 1

## 2012-09-16 MED ORDER — SODIUM CHLORIDE 0.9 % IJ SOLN: 3.0000 mL | Freq: Two times a day (BID) | INTRAMUSCULAR | Status: DC

## 2012-09-16 MED ORDER — SODIUM CHLORIDE 0.9 % IV BOLUS (SEPSIS)
1000.0000 mL | Freq: Once | INTRAVENOUS | Status: AC
  Administered 2012-09-16: 1000 mL via INTRAVENOUS

## 2012-09-16 MED ORDER — DEXTROSE 5 % IV SOLN: 1.0000 g | INTRAVENOUS | Status: DC

## 2012-09-16 NOTE — ED Notes (Signed)
Daughters phone number [phone]

## 2012-09-16 NOTE — ED Notes (Signed)
Per EMS: pt from [facility]; staff states pts was "normal" at breakfast; staff states at lunch time pt was not responsive/not speaking as much as normal and BP was low 90 palpated; initial BP 90/60 on scene; 172/79 on monitor en route; pt denies pain; pt has hx of dementia and alzheimer's; HR looked like a fib and was uncontrolled initially.

## 2012-09-16 NOTE — H&P (Signed)
[facility] History and Physical  [name] [name] ZOX:[id number] DOB: 08-20-1921 DOA: 09/16/2012  Referring physician: Dr. Rhunette Croft PCP: No primary provider on file.  Specialists: none  Chief Complaint: AMS  HPI: [name] [name] is a 79 y.o. female has a past medical history significant for dementia, coronary artery disease, presents from a nursing home with a chief complaint of altered mental status. Patient apparently was at her baseline this morning, and then after breakfast and later in the morning she became less responsive. Her daughter is in the room and she provides most of the story is patient is nonverbal at this point. She states that at her baseline, patient opens her eyes, answers questions to some extent as she is very hard of hearing. She is nonambulatory. Per her daughter, the patient didn't complain of any fevers, chills, chest pain or breathing difficulties prior to this event. Of note, there is a documented form in the patient's chart dated in 2009 the patient is a full code. I have readdressed this with the daughter today, and she states that her mother's wishes were to be full code. Her daughter does mention that she has had an episode like this about 2 years ago but does not recall specifics as to what was causing this and how he was treated. She also endorses that her mother has been told on multiple occasions that she is dehydrated.  Review of Systems: Unable to obtain review of systems because of acute encephalopathy  Past Medical History  Diagnosis Date  . Alzheimer's dementia   . Dementia   . CAD (coronary artery disease)   . Malnutrition   . Osteoporosis   . Old MI (myocardial infarction)   . GERD (gastroesophageal reflux disease)   . Pemphigoid   . Edema    History reviewed. No pertinent past surgical history. Social History:  has no tobacco, alcohol, and drug history on file.  No Known Allergies  History reviewed. No pertinent family history.  Prior  to Admission medications   Medication Sig Start Date End Date Taking? Authorizing Provider  alendronate (FOSAMAX) 70 MG tablet Take 70 mg by mouth every 7 (seven) days. Takes on Wednesdays with a full glass of water on an empty stomach.   Yes Historical Provider, MD  ALPRAZolam (XANAX) 0.25 MG tablet Take 0.25 mg by mouth 2 (two) times daily as needed for sleep.   Yes Historical Provider, MD  calcium carbonate (OS-CAL - DOSED IN MG OF ELEMENTAL CALCIUM) 1250 MG tablet Take 1 tablet by mouth 3 (three) times daily.   Yes Historical Provider, MD  clopidogrel (PLAVIX) 75 MG tablet Take 75 mg by mouth every morning.   Yes Historical Provider, MD  donepezil (ARICEPT) 10 MG tablet Take 10 mg by mouth at bedtime.   Yes Historical Provider, MD  hydrOXYzine (ATARAX/VISTARIL) 25 MG tablet Take 25 mg by mouth 2 (two) times daily as needed for itching.   Yes Historical Provider, MD  lactulose (CHRONULAC) 10 GM/15ML solution Take 30 g by mouth at bedtime.   Yes Historical Provider, MD  naproxen sodium (ANAPROX) 220 MG tablet Take 220 mg by mouth 2 (two) times daily with a meal.   Yes Historical Provider, MD  polyethylene glycol (MIRALAX / GLYCOLAX) packet Take 17 g by mouth every morning.   Yes Historical Provider, MD  Protein (PROCEL) POWD Take 1 scoop by mouth daily.   Yes Historical Provider, MD  ranitidine (ZANTAC) 150 MG tablet Take 150 mg by mouth 2 (two) times daily.  Yes Historical Provider, MD  senna-docusate (SENNA-S) 8.6-50 MG per tablet Take 2 tablets by mouth 2 (two) times daily.   Yes Historical Provider, MD  Vitamin D, Ergocalciferol, (DRISDOL) 50000 UNITS CAPS Take 50,000 Units by mouth every 30 (thirty) days. Takes on the 26th of each month.   Yes Historical Provider, MD   Physical Exam: Filed Vitals:   09/16/12 1356 09/16/12 1519  BP: 161/96 142/67  Pulse: 77 81  Temp: 98.7 F (37.1 C)   TempSrc: Rectal   Resp: 15 13  SpO2: 98%     General:  NAD, she is nonverbal however nods yes and  no  Eyes: The patient is not opening her eyes, and my attempts have been met by significant resistance  ENT: Patient is not open her mouth and not following commands  Neck: supple, no JVD  Cardiovascular: RRR without MRG   Respiratory: CTA biL, good air movement without wheezing, rhonchi or crackled  Abdomen: soft, non tender to palpation, positive bowel sounds, no guarding, no rebound  Skin: no rashes  Musculoskeletal: no peripheral edema  Psychiatric: Confused  Neurologic: Appears to move all 4 extremities, however does not follow commands  Labs on Admission:  Basic Metabolic Panel:  Recent Labs Lab 09/16/12 1412  NA 154*  K 4.0  CL 118*  CO2 24  GLUCOSE 111*  BUN 23  CREATININE 1.01  CALCIUM 9.8  MG 2.5  PHOS 3.5   Liver Function Tests:  Recent Labs Lab 09/16/12 1412  AST 15  ALT 13  ALKPHOS 68  BILITOT 0.2*  PROT 7.5  ALBUMIN 3.2*   CBC:  Recent Labs Lab 09/16/12 1412  WBC 6.5  NEUTROABS 4.2  HGB 12.1  HCT 40.3  MCV 80.8  PLT 226   Cardiac Enzymes:  Recent Labs Lab 09/16/12 1413  TROPONINI <0.30     Radiological Exams on Admission: Ct Head Wo Contrast  09/16/2012  *RADIOLOGY REPORT*  Clinical Data: Patient unresponsive.  Altered speech.  CT HEAD WITHOUT CONTRAST  Technique:  Contiguous axial images were obtained from the base of the skull through the vertex without contrast.  Comparison: Head CT scan 01/27/2007.  Findings: There is cortical atrophy and extensive chronic microvascular ischemic change.  No evidence of acute abnormality including infarct, hemorrhage, mass lesion, mass effect, midline shift or abnormal extra-axial fluid collection.  Calvarium intact. Imaged paranasal sinuses and mastoid air cells are clear.  IMPRESSION: No acute finding.  Atrophy and chronic microvascular ischemic change.   Original Report Authenticated By: Holley Dexter, M.D.     EKG: Independently reviewed.  Assessment/Plan Active Problems:   UTI  (urinary tract infection)   Hypernatremia   Dementia   CAD (coronary artery disease)   Urinary tract infection - Empiric Rocephin - Will followup with urine cultures - Initial urinalysis cloudy moderate leukocytes many bacteria  Questionable A. Fib -  Based on initial EKG, however on my review the EKG is of poor quality, I think I see P waves before every QRS complex  - Telemetry now shows sinus rhythm  - Will monitor on telemetry overnight   Coronary artery disease - Continue on Plavix  Dementia - Continue home medications  Hypernatremia - Unclear baseline - Gentle IV hydration we'll monitor BMP in the morning - Her hyponatremia is likely secondary to dehydration  Prophylaxis - Heparin subcutaneous  Code Status:  Full code   Family Communication:  daughter at bedside   Disposition Plan:  likely back to skilled nursing facility in  one to 2 days   Time spent:  8  Costin M. Elvera Lennox, MD [facility] Pager [phone]  If 7PM-7AM, please contact night-coverage [email or web address] Password [facility] 09/16/2012, 4:35 PM

## 2012-09-16 NOTE — ED Notes (Signed)
Report called to floor nurse, Shanda Bumps, RN; Nurse has no further questions upon report given; pt being prepared for transport to floor

## 2012-09-16 NOTE — ED Provider Notes (Signed)
History     CSN: [id number]  Arrival date & time 09/16/12  1346   First MD Initiated Contact with Patient 09/16/12 1348      Chief Complaint  Patient presents with  . Altered Mental Status    (Consider location/radiation/quality/duration/timing/severity/associated sxs/prior treatment) HPI Comments: Pt with dementia comes in from [facility] with cc of AMS. Nursing home reports that patient though demented, is usually verbal - until this afternoon, when she stopped interacting all of a sudden and stopped responding. No falls, no fevers. Pt is not responding to any of my questions, nor is she following commands - however, she is making purposeful movements - and did mention her name (i.e. - protecting airway fine).  Patient is a 79 y.o. female presenting with altered mental status. The history is provided by the nursing home. The history is limited by the condition of the patient.  Altered Mental Status    Past Medical History  Diagnosis Date  . Alzheimer's dementia   . Dementia   . CAD (coronary artery disease)   . Malnutrition   . Osteoporosis   . Old MI (myocardial infarction)   . GERD (gastroesophageal reflux disease)   . Pemphigoid   . Edema     History reviewed. No pertinent past surgical history.  History reviewed. No pertinent family history.  History  Substance Use Topics  . Smoking status: Not on file  . Smokeless tobacco: Not on file  . Alcohol Use: Not on file    OB History   Grav Para Term Preterm Abortions TAB SAB Ect Mult Living                  Review of Systems  Unable to perform ROS: Dementia  Psychiatric/Behavioral: Positive for altered mental status.    Allergies  Review of patient's allergies indicates no known allergies.  Home Medications   Current Outpatient Rx  Name  Route  Sig  Dispense  Refill  . alendronate (FOSAMAX) 70 MG tablet   Oral   Take 70 mg by mouth every 7 (seven) days. Takes on Wednesdays with a full glass of water on an  empty stomach.         . ALPRAZolam (XANAX) 0.25 MG tablet   Oral   Take 0.25 mg by mouth 2 (two) times daily as needed for sleep.         . calcium carbonate (OS-CAL - DOSED IN MG OF ELEMENTAL CALCIUM) 1250 MG tablet   Oral   Take 1 tablet by mouth 3 (three) times daily.         . clopidogrel (PLAVIX) 75 MG tablet   Oral   Take 75 mg by mouth every morning.         . donepezil (ARICEPT) 10 MG tablet   Oral   Take 10 mg by mouth at bedtime.         . hydrOXYzine (ATARAX/VISTARIL) 25 MG tablet   Oral   Take 25 mg by mouth 2 (two) times daily as needed for itching.         . lactulose (CHRONULAC) 10 GM/15ML solution   Oral   Take 30 g by mouth at bedtime.         . naproxen sodium (ANAPROX) 220 MG tablet   Oral   Take 220 mg by mouth 2 (two) times daily with a meal.         . polyethylene glycol (MIRALAX / GLYCOLAX) packet   Oral  Take 17 g by mouth every morning.         . Protein (PROCEL) POWD   Oral   Take 1 scoop by mouth daily.         . ranitidine (ZANTAC) 150 MG tablet   Oral   Take 150 mg by mouth 2 (two) times daily.         Marland Kitchen senna-docusate (SENNA-S) 8.6-50 MG per tablet   Oral   Take 2 tablets by mouth 2 (two) times daily.         . Vitamin D, Ergocalciferol, (DRISDOL) 50000 UNITS CAPS   Oral   Take 50,000 Units by mouth every 30 (thirty) days. Takes on the 26th of each month.           BP 146/98  Pulse 76  Temp(Src) 98.2 F (36.8 C) (Axillary)  Resp 16  SpO2 100%  Physical Exam  Nursing note and vitals reviewed. Constitutional: She is oriented to person, place, and time. She appears well-developed and well-nourished.  HENT:  Head: Normocephalic and atraumatic.  Eyes: EOM are normal. Pupils are equal, round, and reactive to light.  Neck: Neck supple.  Cardiovascular: Normal rate and regular rhythm.   Murmur heard. Pulmonary/Chest: Effort normal. No respiratory distress.  Abdominal: Soft. She exhibits no  distension. There is no tenderness. There is no rebound and no guarding.  Neurological: She is alert and oriented to person, place, and time.  Skin: Skin is warm and dry. No rash noted.    ED Course  Procedures (including critical care time)  Labs Reviewed  CBC WITH DIFFERENTIAL - Abnormal; Notable for the following:    MCH 24.2 (*)    RDW 16.2 (*)    All other components within normal limits  BASIC METABOLIC PANEL - Abnormal; Notable for the following:    Sodium 154 (*)    Chloride 118 (*)    Glucose, Bld 111 (*)    GFR calc non Af Amer 48 (*)    GFR calc Af Amer 56 (*)    All other components within normal limits  URINALYSIS, ROUTINE W REFLEX MICROSCOPIC - Abnormal; Notable for the following:    APPearance CLOUDY (*)    Ketones, ur 15 (*)    Protein, ur 30 (*)    Leukocytes, UA MODERATE (*)    All other components within normal limits  HEPATIC FUNCTION PANEL - Abnormal; Notable for the following:    Albumin 3.2 (*)    Total Bilirubin 0.2 (*)    All other components within normal limits  URINE MICROSCOPIC-ADD ON - Abnormal; Notable for the following:    Bacteria, UA MANY (*)    All other components within normal limits  URINE CULTURE  MAGNESIUM  PHOSPHORUS  TROPONIN I  PROTIME-INR  APTT  OSMOLALITY, URINE  BASIC METABOLIC PANEL  CBC   Ct Head Wo Contrast  09/16/2012  *RADIOLOGY REPORT*  Clinical Data: Patient unresponsive.  Altered speech.  CT HEAD WITHOUT CONTRAST  Technique:  Contiguous axial images were obtained from the base of the skull through the vertex without contrast.  Comparison: Head CT scan 01/27/2007.  Findings: There is cortical atrophy and extensive chronic microvascular ischemic change.  No evidence of acute abnormality including infarct, hemorrhage, mass lesion, mass effect, midline shift or abnormal extra-axial fluid collection.  Calvarium intact. Imaged paranasal sinuses and mastoid air cells are clear.  IMPRESSION: No acute finding.  Atrophy and  chronic microvascular ischemic change.   Original Report Authenticated By:  Holley Dexter, M.D.      1. Hypernatremia   2. Altered mental status   3. UTI (urinary tract infection)       MDM   Date: 09/16/2012  Rate: 81  Rhythm: normal sinus rhythm  QRS Axis: normal  Intervals: normal  ST/T Wave abnormalities: nonspecific ST/T changes  Conduction Disutrbances:none  Narrative Interpretation:   Old EKG Reviewed: unchanged  DDx: Sepsis syndrome ACS syndrome DKA ICH Stroke Infection - pneumonia/UTI/Cellulitis Dehydration Electrolyte abnormality Tox syndrome  Pt comes in with cc of AMS. Pt is not cooperating with hx or exam. The limited hx, and exam are not supportive of specific etiology. Will get infectious workup started along with basic labs.  5:06 PM Pt has a UTI, some hypernatremia. Will admit.   Derwood Kaplan, MD 09/16/12 1706

## 2012-09-16 NOTE — ED Notes (Signed)
Family at bedside. 

## 2012-09-16 NOTE — ED Notes (Signed)
MD at bedside. 

## 2012-09-17 DIAGNOSIS — F0390 Unspecified dementia without behavioral disturbance: Secondary | ICD-10-CM

## 2012-09-17 DIAGNOSIS — I2510 Atherosclerotic heart disease of native coronary artery without angina pectoris: Secondary | ICD-10-CM

## 2012-09-17 DIAGNOSIS — N390 Urinary tract infection, site not specified: Secondary | ICD-10-CM

## 2012-09-17 DIAGNOSIS — E870 Hyperosmolality and hypernatremia: Principal | ICD-10-CM

## 2012-09-17 DIAGNOSIS — R4182 Altered mental status, unspecified: Secondary | ICD-10-CM

## 2012-09-17 LAB — URINE CULTURE

## 2012-09-17 LAB — CBC
Hemoglobin: 10.6 g/dL — ABNORMAL LOW (ref 12.0–15.0)
MCH: 23.8 pg — ABNORMAL LOW (ref 26.0–34.0)
MCV: 78.2 fL (ref 78.0–100.0)
RBC: 4.45 MIL/uL (ref 3.87–5.11)

## 2012-09-17 LAB — BASIC METABOLIC PANEL
CO2: 26 mEq/L (ref 19–32)
Glucose, Bld: 83 mg/dL (ref 70–99)
Potassium: 3.7 mEq/L (ref 3.5–5.1)
Sodium: 154 mEq/L — ABNORMAL HIGH (ref 135–145)

## 2012-09-17 MED ORDER — DEXTROSE-NACL 5-0.45 % IV SOLN
INTRAVENOUS | Status: DC
  Administered 2012-09-17: 15:00:00 via INTRAVENOUS

## 2012-09-17 NOTE — Progress Notes (Signed)
[facility] PROGRESS NOTE  [name] [name] VQQ:[id number] DOB: 03-19-1922 DOA: 09/16/2012 PCP: No primary provider on file.  Assessment/Plan: 1-Toxic metabolic encephalopathy: due to UTI and hypernatremia. -slowly improving -continue IV antibiotics -continue IVF's  2-Hypernatremia: due to poor intake 2/2 to dementia -continue IVF resuscitation, will change fluids to d51/2 NS -BMET in am  3-Dementia:continue supportive care and also the use of aricept.  4-CAD: no CP -continue evaluation on telemetry -continue plavix  5-GERD: continue famotidine  DVT: heparin.  Code Status: Full Family Communication: daughter at bedside Disposition Plan: Back to SNF at discharge when medically stable (most likely 1-2 days)   Consultants:  none  Procedures:  See below for x-ray reports  Antibiotics:  rocephin  HPI/Subjective: No significant overnight events; afebrile  Objective: Filed Vitals:   09/16/12 1943 09/16/12 2200 09/17/12 0500 09/17/12 1400  BP: 159/50  129/112 164/74  Pulse:  76 62 79  Temp:  97.6 F (36.4 C) 97.9 F (36.6 C) 98.2 F (36.8 C)  TempSrc:    Oral  Resp:  18 18   SpO2:  90% 90% 79%    Intake/Output Summary (Last 24 hours) at 09/17/12 1735 Last data filed at 09/17/12 1500  Gross per 24 hour  Intake    100 ml  Output      0 ml  Net    100 ml   There were no vitals filed for this visit.  Exam:   General:  Almost back to baseline, able to follow commands, eating with her daugther and answering yes or no (intermittently appropriate)  Cardiovascular: regular rate, no rubs or gallops  Respiratory: CTA  Abdomen: soft, NT, ND, positive BS  Musculoskeletal: no joint swelling, no erythema  Data Reviewed: Basic Metabolic Panel:  Recent Labs Lab 09/16/12 1412 09/17/12 0524  NA 154* 154*  K 4.0 3.7  CL 118* 120*  CO2 24 26  GLUCOSE 111* 83  BUN 23 17  CREATININE 1.01 0.88  CALCIUM 9.8 9.1  MG 2.5  --   PHOS 3.5  --    Liver  Function Tests:  Recent Labs Lab 09/16/12 1412  AST 15  ALT 13  ALKPHOS 68  BILITOT 0.2*  PROT 7.5  ALBUMIN 3.2*   CBC:  Recent Labs Lab 09/16/12 1412 09/17/12 0524  WBC 6.5 5.0  NEUTROABS 4.2  --   HGB 12.1 10.6*  HCT 40.3 34.8*  MCV 80.8 78.2  PLT 226 200   Cardiac Enzymes:  Recent Labs Lab 09/16/12 1413  TROPONINI <0.30   BNP (last 3 results) No results found for this basename: PROBNP,  in the last 8760 hours CBG: No results found for this basename: GLUCAP,  in the last 168 hours  Recent Results (from the past 240 hour(s))  URINE CULTURE     Status: None   Collection Time    09/16/12  2:18 PM      Result Value Range Status   Specimen Description URINE, CLEAN CATCH   Final   Special Requests NONE   Final   Culture  Setup Time 09/16/2012 15:20   Final   Colony Count NO GROWTH   Final   Culture NO GROWTH   Final   Report Status 09/17/2012 FINAL   Final     Studies: Ct Head Wo Contrast  09/16/2012  *RADIOLOGY REPORT*  Clinical Data: Patient unresponsive.  Altered speech.  CT HEAD WITHOUT CONTRAST  Technique:  Contiguous axial images were obtained from the base of the skull through the  vertex without contrast.  Comparison: Head CT scan 01/27/2007.  Findings: There is cortical atrophy and extensive chronic microvascular ischemic change.  No evidence of acute abnormality including infarct, hemorrhage, mass lesion, mass effect, midline shift or abnormal extra-axial fluid collection.  Calvarium intact. Imaged paranasal sinuses and mastoid air cells are clear.  IMPRESSION: No acute finding.  Atrophy and chronic microvascular ischemic change.   Original Report Authenticated By: Holley Dexter, M.D.     Scheduled Meds: . calcium carbonate  1 tablet Oral TID WC  . cefTRIAXone (ROCEPHIN)  IV  1 g Intravenous Q24H  . clopidogrel  75 mg Oral Q breakfast  . donepezil  10 mg Oral QHS  . famotidine  20 mg Oral Daily  . heparin  5,000 Units Subcutaneous Q8H  . lactulose   30 g Oral QHS  . sodium chloride  3 mL Intravenous Q12H   Continuous Infusions: . dextrose 5 % and 0.45% NaCl 75 mL/hr at 09/17/12 1527    Active Problems:   UTI (urinary tract infection)   Hypernatremia   Dementia   CAD (coronary artery disease)    Time spent: >30    [name] [name]  [facility] Pager [phone] If 7PM-7AM, please contact night-coverage at [email or web address], password [facility] 09/17/2012, 5:35 PM  LOS: 1 day

## 2012-09-17 NOTE — Progress Notes (Signed)
Clinical Social Work Department BRIEF PSYCHOSOCIAL ASSESSMENT 09/17/2012  Patient:  [facility]     Account Number:  [id number]     Admit date:  09/16/2012  Clinical Social Worker:  Kirke Shaggy  Date/Time:  09/17/2012 02:18 PM  Referred by:  Physician  Date Referred:  09/17/2012 Referred for  SNF Placement   Other Referral:   Interview type:  Family Other interview type:    PSYCHOSOCIAL DATA Living Status:  FACILITY Admitted from facility:  [facility] Level of care:  Skilled Nursing Facility Primary support name:  Nonnie Pickney Primary support relationship to patient:  CHILD, ADULT Degree of support available:   good    CURRENT CONCERNS Current Concerns  Post-Acute Placement   Other Concerns:    SOCIAL WORK ASSESSMENT / PLAN Spoke with dtr Talbert Forest regarding her mother returning back to [facility], dtr is agreeable with her mother going back to [facility] at d/c. CSW contacted [facility] admission coordinator for [facility] and they will be expecting pt at d/c.   Assessment/plan status:   Other assessment/ plan:   CSW Connye Burkitt will be handing off this d/c plans to the weekend CSW to make the arrangements for transportation with PTAR back to [facility] and for the pt's d/c summary to be email or faxed over to [facility].   Information/referral to community resources:    PATIENT'S/FAMILY'S RESPONSE TO PLAN OF CARE: Dtr is agreeable to pt going back to [facility].   Sherald Barge, LCSW-A Clinical Social Worker [phone]

## 2012-09-17 NOTE — Progress Notes (Signed)
Utilization review completed.  

## 2012-09-17 NOTE — Progress Notes (Signed)
Referral received today for SNF placement pt came from [facility]. Pt was out of her room, however CSW will attempt to speak with pt about returning to [facility]. CSW will continue to follow for pt needs, and assessment will follow.   Sherald Barge, LCSW-A Clinical Social Worker [phone]

## 2012-09-18 LAB — BASIC METABOLIC PANEL
CO2: 22 mEq/L (ref 19–32)
Calcium: 9 mg/dL (ref 8.4–10.5)
Chloride: 114 mEq/L — ABNORMAL HIGH (ref 96–112)
Creatinine, Ser: 0.78 mg/dL (ref 0.50–1.10)
Glucose, Bld: 82 mg/dL (ref 70–99)
Sodium: 148 mEq/L — ABNORMAL HIGH (ref 135–145)

## 2012-09-18 MED ORDER — ALPRAZOLAM 0.25 MG PO TABS: 0.2500 mg | ORAL_TABLET | Freq: Two times a day (BID) | ORAL | Status: DC | PRN

## 2012-09-18 MED ORDER — ALPRAZOLAM 0.25 MG PO TABS: 0.2500 mg | ORAL_TABLET | Freq: Two times a day (BID) | ORAL | Status: AC | PRN

## 2012-09-18 MED ORDER — CEFUROXIME AXETIL 500 MG PO TABS: 500.0000 mg | ORAL_TABLET | Freq: Two times a day (BID) | ORAL | Status: AC

## 2012-09-18 MED ORDER — AMLODIPINE BESYLATE 5 MG PO TABS: 5.0000 mg | ORAL_TABLET | Freq: Every day | ORAL | Status: AC

## 2012-09-18 NOTE — Discharge Summary (Signed)
Physician Discharge Summary  [name] [name] RUE:[id number] DOB: May 16, 1922 DOA: 09/16/2012  PCP: No primary provider on file.  Admit date: 09/16/2012 Discharge date: 09/18/2012  Time spent: >30 minutes  Recommendations for Outpatient Follow-up:  1. Reassess BP and adjust medications as needed 2. Maintain good hydration and take medications as provided 3. BMET in 5-7 days to follow electrolytes and kidney function  Discharge Diagnoses:  Active Problems:   UTI (urinary tract infection)   Hypernatremia   Dementia   CAD (coronary artery disease) HTN GERD   Discharge Condition: stable and improved. Mentation back to baseline at discharge. Needs to maintain good hydration and to follow low sodium diet.  Diet recommendation: low sodium heart healthy diet  Filed Weights   09/18/12 0552  Weight: 65.363 kg (144 lb 1.6 oz)    History of present illness:  79 y.o. female has a past medical history significant for dementia, coronary artery disease, presents from a nursing home with a chief complaint of altered mental status. Patient apparently was at her baseline this morning, and then after breakfast and later in the morning she became less responsive. Her daughter is in the room and she provides most of the story is patient is nonverbal at this point. She states that at her baseline, patient opens her eyes, answers questions to some extent as she is very hard of hearing. She is nonambulatory. Per her daughter, the patient didn't complain of any fevers, chills, chest pain or breathing difficulties prior to this event. Of note, there is a documented form in the patient's chart dated in 2009 the patient is a full code. I have readdressed this with the daughter today, and she states that her mother's wishes were to be full code. Her daughter does mention that she has had an episode like this about 2 years ago but does not recall specifics as to what was causing this and how he was treated. She also  endorses that her mother has been told on multiple occasions that she is dehydrated.  Hospital Course:  1-Toxic metabolic encephalopathy: due to UTI and hypernatremia.  -improved and back to baseline. -will finish treatment of UTI with PO antibiotics -continue and maintain good hydration.  2-Hypernatremia: due to poor intake 2/2 to dementia  -Na down to 148 -intake and drinking at baseline -needs to maintain good hydration (especially free water) -low sodium diet to help -BMET in 5-7 days to follow electrolytes  3-Dementia: continue supportive care and also the use of aricept.  -will be discharge back to SNF for further care. -PRN xanax for agitation  4-CAD: no CP  -continue plavix  -advised to follow heart healthy diet  5-GERD: will continue famotidine   6-UTI: treated with IV abx's for 3 days. -will finish a total of 7 days course with the use of ceftin BID at discharge. -advise to keep herself hydrated.  7-HTN: started on amlodipine 5mg  dialy -advised to follow a low sodium diet  Procedures: See below for x-ray reports  Consultations:  None  Discharge Exam: Filed Vitals:   09/17/12 1400 09/17/12 2229 09/18/12 0017 09/18/12 0552  BP: 164/74 187/63 162/82 189/71  Pulse: 79 98  75  Temp: 98.2 F (36.8 C) 99.7 F (37.6 C)  98.1 F (36.7 C)  TempSrc: Oral Axillary  Axillary  Resp:  20    Weight:    65.363 kg (144 lb 1.6 oz)  SpO2: 79% 95%      General: Alert , awake and oriented to person (baseline);  afebrile; NAD Cardiovascular: S1 and S2, no rubs or gallops Respiratory: CTA bilaterally Abdomen: soft, NT, Nd positive BS Neurology: non focal  Discharge Instructions  Discharge Orders   Future Orders Complete By Expires     Diet - low sodium heart healthy  As directed     Discharge instructions  As directed     Comments:      -Maintain good hydration, especially of free water -take medications as prescribed -Follow BMET in 5-7 days to follow kidney  function and electrolytes        Medication List    STOP taking these medications       naproxen sodium 220 MG tablet  Commonly known as:  ANAPROX      TAKE these medications       alendronate 70 MG tablet  Commonly known as:  FOSAMAX  Take 70 mg by mouth every 7 (seven) days. Takes on Wednesdays with a full glass of water on an empty stomach.     ALPRAZolam 0.25 MG tablet  Commonly known as:  XANAX  Take 1 tablet (0.25 mg total) by mouth every 12 (twelve) hours as needed for sleep or anxiety.     amLODipine 5 MG tablet  Commonly known as:  NORVASC  Take 1 tablet (5 mg total) by mouth daily.     calcium carbonate 1250 MG tablet  Commonly known as:  OS-CAL - dosed in mg of elemental calcium  Take 1 tablet by mouth 3 (three) times daily.     cefUROXime 500 MG tablet  Commonly known as:  CEFTIN  Take 1 tablet (500 mg total) by mouth 2 (two) times daily.     clopidogrel 75 MG tablet  Commonly known as:  PLAVIX  Take 75 mg by mouth every morning.     donepezil 10 MG tablet  Commonly known as:  ARICEPT  Take 10 mg by mouth at bedtime.     hydrOXYzine 25 MG tablet  Commonly known as:  ATARAX/VISTARIL  Take 25 mg by mouth 2 (two) times daily as needed for itching.     lactulose 10 GM/15ML solution  Commonly known as:  CHRONULAC  Take 30 g by mouth at bedtime.     polyethylene glycol packet  Commonly known as:  MIRALAX / GLYCOLAX  Take 17 g by mouth every morning.     ProCel Powd  Take 1 scoop by mouth daily.     ranitidine 150 MG tablet  Commonly known as:  ZANTAC  Take 150 mg by mouth 2 (two) times daily.     SENNA-S 8.6-50 MG per tablet  Generic drug:  senna-docusate  Take 2 tablets by mouth 2 (two) times daily.     Vitamin D (Ergocalciferol) 50000 UNITS Caps  Commonly known as:  DRISDOL  Take 50,000 Units by mouth every 30 (thirty) days. Takes on the 26th of each month.          The results of significant diagnostics from this hospitalization  (including imaging, microbiology, ancillary and laboratory) are listed below for reference.    Significant Diagnostic Studies: Ct Head Wo Contrast  09/16/2012  *RADIOLOGY REPORT*  Clinical Data: Patient unresponsive.  Altered speech.  CT HEAD WITHOUT CONTRAST  Technique:  Contiguous axial images were obtained from the base of the skull through the vertex without contrast.  Comparison: Head CT scan 01/27/2007.  Findings: There is cortical atrophy and extensive chronic microvascular ischemic change.  No evidence of acute abnormality including infarct, hemorrhage,  mass lesion, mass effect, midline shift or abnormal extra-axial fluid collection.  Calvarium intact. Imaged paranasal sinuses and mastoid air cells are clear.  IMPRESSION: No acute finding.  Atrophy and chronic microvascular ischemic change.   Original Report Authenticated By: Holley Dexter, M.D.     Microbiology: Recent Results (from the past 240 hour(s))  URINE CULTURE     Status: None   Collection Time    09/16/12  2:18 PM      Result Value Range Status   Specimen Description URINE, CLEAN CATCH   Final   Special Requests NONE   Final   Culture  Setup Time 09/16/2012 15:20   Final   Colony Count NO GROWTH   Final   Culture NO GROWTH   Final   Report Status 09/17/2012 FINAL   Final     Labs: Basic Metabolic Panel:  Recent Labs Lab 09/16/12 1412 09/17/12 0524 09/18/12 0500  NA 154* 154* 148*  K 4.0 3.7 3.8  CL 118* 120* 114*  CO2 24 26 22   GLUCOSE 111* 83 82  BUN 23 17 11   CREATININE 1.01 0.88 1.47  CALCIUM 9.8 9.1 9.0  MG 2.5  --   --   PHOS 3.5  --   --    Liver Function Tests:  Recent Labs Lab 09/16/12 1412  AST 15  ALT 13  ALKPHOS 68  BILITOT 0.2*  PROT 7.5  ALBUMIN 3.2*   CBC:  Recent Labs Lab 09/16/12 1412 09/17/12 0524  WBC 6.5 5.0  NEUTROABS 4.2  --   HGB 12.1 10.6*  HCT 40.3 34.8*  MCV 80.8 78.2  PLT 226 200   Cardiac Enzymes:  Recent Labs Lab 09/16/12 1413  TROPONINI <0.30     Signed:  Pearlina Friedly  [facility] 09/18/2012, 11:28 AM

## 2012-09-18 NOTE — Clinical Social Work Note (Signed)
Patient to return to [facility] today via [facility]. Patient's daughter and facility aware of transfer. Discharge packet complete. CSW signing off.  Ricke Hey, [place] [phone] (weekend)

## 2012-09-21 ENCOUNTER — Non-Acute Institutional Stay (SKILLED_NURSING_FACILITY): Payer: Medicare Other | Admitting: Internal Medicine

## 2012-09-21 DIAGNOSIS — I2510 Atherosclerotic heart disease of native coronary artery without angina pectoris: Secondary | ICD-10-CM

## 2012-09-21 DIAGNOSIS — F0390 Unspecified dementia without behavioral disturbance: Secondary | ICD-10-CM

## 2012-09-21 DIAGNOSIS — E870 Hyperosmolality and hypernatremia: Secondary | ICD-10-CM

## 2012-09-21 DIAGNOSIS — I10 Essential (primary) hypertension: Secondary | ICD-10-CM

## 2012-10-01 NOTE — Progress Notes (Signed)
Patient ID: [name] [name], female   DOB: Jul 18, 1921, 79 y.o.   MRN: [id number]        HISTORY & PHYSICAL  DATE:   09/21/2012  FACILITY:  [facility]   LEVEL OF CARE: SNF   ALLERGIES:  No Known Allergies   CHIEF COMPLAINT:  Manage coronary artery disease, hypertension and dementia.    HISTORY OF PRESENT ILLNESS:  The patient is an 79 year-old, African-American female was hospitalized secondary to toxic metabolic encephalopathy.  After hospitalization, she is readmitted back to the facility for long-term care management.  She does not follow commands.     DEMENTIA: The dementia remains stable and continues to function adequately in the current living environment with supervision.  The patient has had little changes in behavior. No complications noted from the medications presently being used. Dementia is advanced.  CAD: The angina has been stable. Staff do not report any cardiac symptoms.  No complications noted from the medication presently being used.   HTN: Pt 's HTN remains stable.  Staff deny CP, sob, DOE, pedal edema, headaches, dizziness or visual disturbances.  No complications from the medications currently being used.  Last BP :  152/77.  PAST MEDICAL HISTORY :  Past Medical History  Diagnosis Date  . Alzheimer's dementia   . Dementia   . CAD (coronary artery disease)   . Malnutrition   . Osteoporosis   . Old MI (myocardial infarction)   . GERD (gastroesophageal reflux disease)   . Pemphigoid   . Edema      PAST SURGICAL HISTORY:unobtainable due to dementia  SOCIAL HISTORY:  Unobtainable due to dementia.   FAMILY HISTORY: Unobtainable due to dementia  CURRENT MEDICATIONS: Reviewed per [facility]  REVIEW OF SYSTEMS:  Unobtainable due to dementia.    PHYSICAL EXAMINATION  VS:  T   97.1      P   79    RR   18    BP   152/77    POX%        WT (Lb)  GENERAL: no acute distress, normal body habitus SKIN: warm & dry, no suspicious lesions or rashes, no excessive  dryness EYES: unable to assess  MOUTH/THROAT: unable to assess  NECK: supple, trachea midline, no neck masses, no thyroid tenderness, no thyromegaly LYMPHATICS: no LAN in the neck, no supraclavicular LAN RESPIRATORY: breathing is even & unlabored, BS CTAB CARDIAC: RRR, no murmur,no extra heart sounds, no edema GI:  ABDOMEN: abdomen soft, normal BS, no masses, no tenderness  LIVER/SPLEEN: no hepatomegaly, no splenomegaly MUSCULOSKELETAL:  unable to assess  PSYCHIATRIC: the patient is minimally alert, disoriented, affect & behavior appropriate  LABS/RADIOLOGY: Sodium 148, chloride 114, otherwise BMP normal.   Magnesium 2.5, phosphorus 3.5, albumin 3.2, otherwise liver profile normal.   Hemoglobin 10.6, MCV 78.2, otherwise CBC normal.   Troponin-I less than 0.03.   Urine culture showed no growth.   ASSESSMENT/PLAN:  Hypertension.  Blood pressure elevated.  We will review a log.   Coronary artery disease.  Stable.   Dementia.  Very advanced.   Hypernatremia.  Reassess.   Anemia of chronic disease.  Reassess.   Check CBC and BMP.   Constipation.  Continue current laxatives.    I have reviewed patient's medical records received at admission/from hospitalization.  CPT CODE: 40981.

## 2012-11-10 ENCOUNTER — Non-Acute Institutional Stay (SKILLED_NURSING_FACILITY): Payer: Medicare Other | Admitting: Internal Medicine

## 2012-11-10 DIAGNOSIS — F0280 Dementia in other diseases classified elsewhere without behavioral disturbance: Secondary | ICD-10-CM

## 2012-11-10 DIAGNOSIS — I10 Essential (primary) hypertension: Secondary | ICD-10-CM

## 2012-11-10 DIAGNOSIS — M810 Age-related osteoporosis without current pathological fracture: Secondary | ICD-10-CM

## 2012-11-10 DIAGNOSIS — I2510 Atherosclerotic heart disease of native coronary artery without angina pectoris: Secondary | ICD-10-CM

## 2012-11-10 DIAGNOSIS — G309 Alzheimer's disease, unspecified: Secondary | ICD-10-CM

## 2012-11-13 DIAGNOSIS — I10 Essential (primary) hypertension: Secondary | ICD-10-CM | POA: Insufficient documentation

## 2012-11-13 DIAGNOSIS — F0280 Dementia in other diseases classified elsewhere without behavioral disturbance: Secondary | ICD-10-CM | POA: Insufficient documentation

## 2012-11-13 DIAGNOSIS — M810 Age-related osteoporosis without current pathological fracture: Secondary | ICD-10-CM | POA: Insufficient documentation

## 2012-11-13 NOTE — Progress Notes (Signed)
PROGRESS NOTE  DATE: 11/10/2012  FACILITY: Nursing Home Location: [facility]  LEVEL OF CARE: SNF (31)  Routine Visit  CHIEF COMPLAINT:  Manage Alzheimer's dementia, hypertension and osteoporosis  HISTORY OF PRESENT ILLNESS:  REASSESSMENT OF ONGOING PROBLEM(S):  1. DEMENTIA: The dementia remaines stable and continues to function adequately in the current living environment with supervision.  The patient has had little changes in behavior. No complications noted from the medications presently being used. Dementia is advanced. Patient does not follow commands.  2. HTN: Pt 's HTN remains stable. Staff  Deny CP, sob, DOE, pedal edema, headaches, dizziness or visual disturbances.  No complications from the medications currently being used.  Last BP : 130/64.  3. OSTEOPOROSIS: The patient's osteoporosis remains stable. The staff deny recent worsening of pain. There has not been any evidence of fractures. No complications noted from the medications presently being used.  PAST MEDICAL HISTORY : Reviewed.  No changes.  CURRENT MEDICATIONS: Reviewed per [facility]  REVIEW OF SYSTEMS: Unobtainable due to dementia  PHYSICAL EXAMINATION  VS:  T 97       P 75      RR 18      BP 130/64     POX %     WT (Lb)  GENERAL: no acute distress, normal body habitus EYES: Unable to assess NECK: supple, trachea midline, no neck masses, no thyroid tenderness, no thyromegaly LYMPHATICS: no LAN in the neck, no supraclavicular LAN RESPIRATORY: breathing is even & unlabored, BS CTAB CARDIAC: RRR, no murmur,no extra heart sounds, no edema GI: abdomen soft, normal BS, no masses, no tenderness, no hepatomegaly, no splenomegaly PSYCHIATRIC: the patient is alert & disoriented, affect & behavior appropriate  LABS/RADIOLOGY:  4/14 BMP normal, hemoglobin 11.3, MCV 76.5 otherwise CBC normal  ASSESSMENT/PLAN:  1. Alzheimer's dementia-advanced. 2. Hypertension-well-controlled. 3.  osteoporosis-continue current medications. 4. CAD-stable. 5. constipation-well-controlled. 6. anemia of chronic disease-stable. 7. check liver profile.  CPT CODE: 29562

## 2013-01-06 ENCOUNTER — Encounter: Payer: Self-pay | Admitting: Adult Health

## 2013-01-06 ENCOUNTER — Non-Acute Institutional Stay (SKILLED_NURSING_FACILITY): Payer: Medicare Other | Admitting: Adult Health

## 2013-01-06 DIAGNOSIS — I10 Essential (primary) hypertension: Secondary | ICD-10-CM

## 2013-01-06 DIAGNOSIS — K5900 Constipation, unspecified: Secondary | ICD-10-CM

## 2013-01-06 DIAGNOSIS — F0280 Dementia in other diseases classified elsewhere without behavioral disturbance: Secondary | ICD-10-CM

## 2013-01-06 DIAGNOSIS — M810 Age-related osteoporosis without current pathological fracture: Secondary | ICD-10-CM

## 2013-01-06 DIAGNOSIS — I2510 Atherosclerotic heart disease of native coronary artery without angina pectoris: Secondary | ICD-10-CM

## 2013-01-06 DIAGNOSIS — K219 Gastro-esophageal reflux disease without esophagitis: Secondary | ICD-10-CM | POA: Insufficient documentation

## 2013-01-06 NOTE — Progress Notes (Signed)
  Subjective:    Patient ID: [name] [name], female    DOB: 26-Jul-1921, 79 y.o.   MRN: [id number]  HPI This is an 79 year old female who is being seen for a routine visit. It was noted that patient's systolic blood pressures has been elevated- 157, 178  . No complaints of dizziness nor chest pain.     Review of Systems  Unable to perform ROS: Dementia  Neurological: Negative.        Objective:   Physical Exam  Nursing note and vitals reviewed. Constitutional: She appears well-developed and well-nourished.  HENT:  Head: Normocephalic and atraumatic.  Right Ear: External ear normal.  Left Ear: External ear normal.  Nose: Nose normal.  Mouth/Throat: Oropharynx is clear and moist.  Eyes: Conjunctivae and EOM are normal. Pupils are equal, round, and reactive to light.  Neck: Normal range of motion. Neck supple.  Cardiovascular: Normal rate, regular rhythm, normal heart sounds and intact distal pulses.   Pulmonary/Chest: Effort normal and breath sounds normal.  Abdominal: Soft. Bowel sounds are normal.  Musculoskeletal: Normal range of motion. She exhibits no edema and no tenderness.  Neurological: She is alert.  Alert to self only but confused to place and time.  Skin: Skin is warm and dry.  Psychiatric: She has a normal mood and affect. Her behavior is normal.     LABS/RADIOLOGY: 11/11/12 the liver profile is normal except albumin  2.3 4/14 BMP normal, hemoglobin 11.3, MCV 76.5 otherwise CBC normal       Medications reviewed.  Assessment & Plan:   Constipation - stable  GERD (gastroesophageal reflux disease) - stable  Osteoporosis, unspecified - stable  Essential hypertension, benign -increase Norvasc to 10 mg by mouth daily; BP every shift x1 week  Alzheimer's disease -stable  CAD (coronary artery disease) - stable

## 2013-02-09 ENCOUNTER — Non-Acute Institutional Stay (SKILLED_NURSING_FACILITY): Payer: Medicare Other | Admitting: Internal Medicine

## 2013-02-09 DIAGNOSIS — F0280 Dementia in other diseases classified elsewhere without behavioral disturbance: Secondary | ICD-10-CM

## 2013-02-09 DIAGNOSIS — I10 Essential (primary) hypertension: Secondary | ICD-10-CM

## 2013-02-09 DIAGNOSIS — M810 Age-related osteoporosis without current pathological fracture: Secondary | ICD-10-CM

## 2013-02-09 DIAGNOSIS — I2510 Atherosclerotic heart disease of native coronary artery without angina pectoris: Secondary | ICD-10-CM

## 2013-02-11 NOTE — Progress Notes (Signed)
PROGRESS NOTE  DATE: 02-09-13  FACILITY: Nursing Home Location: [facility]  LEVEL OF CARE: SNF (31)  Routine Visit  CHIEF COMPLAINT:  Manage Alzheimer's dementia, hypertension and osteoporosis  HISTORY OF PRESENT ILLNESS:  REASSESSMENT OF ONGOING PROBLEM(S):  DEMENTIA: The dementia remaines stable and continues to function adequately in the current living environment with supervision.  The patient has had little changes in behavior. No complications noted from the medications presently being used. Dementia is advanced. Patient does not follow commands.  HTN: Pt 's HTN remains stable. Staff  Deny CP, sob, DOE, pedal edema, headaches, dizziness or visual disturbances.  No complications from the medications currently being used.  Last BP : 130/64, 146/80.  OSTEOPOROSIS: The patient's osteoporosis remains stable. The staff deny recent worsening of pain. There has not been any evidence of fractures. No complications noted from the medications presently being used.  PAST MEDICAL HISTORY : Reviewed.  No changes.  CURRENT MEDICATIONS: Reviewed per [facility]  REVIEW OF SYSTEMS: Unobtainable due to dementia  PHYSICAL EXAMINATION  VS:  T 97.9       P 77      RR 18      BP 146/80     POX % 98     WT (Lb)  GENERAL: no acute distress, normal body habitus EYES: Unable to assess NECK: supple, trachea midline, no neck masses, no thyroid tenderness, no thyromegaly LYMPHATICS: no LAN in the neck, no supraclavicular LAN RESPIRATORY: breathing is even & unlabored, BS CTAB CARDIAC: RRR, no murmur,no extra heart sounds, no edema GI: abdomen soft, normal BS, no masses, no tenderness, no hepatomegaly, no splenomegaly PSYCHIATRIC: the patient is alert & disoriented, affect & behavior appropriate  LABS/RADIOLOGY:  5-14 albumin 3.3 otherwise liver profile normal  4/14 BMP normal, hemoglobin 11.3, MCV 76.5 otherwise CBC normal  ASSESSMENT/PLAN:  Alzheimer's  dementia-advanced. Hypertension-Norvasc was increased osteoporosis-continue current medications. CAD-stable. constipation-well-controlled. anemia of chronic disease-stable.  CPT CODE: 40981

## 2013-03-03 ENCOUNTER — Non-Acute Institutional Stay (SKILLED_NURSING_FACILITY): Payer: Medicare Other | Admitting: Internal Medicine

## 2013-03-03 DIAGNOSIS — M810 Age-related osteoporosis without current pathological fracture: Secondary | ICD-10-CM

## 2013-03-03 DIAGNOSIS — I2510 Atherosclerotic heart disease of native coronary artery without angina pectoris: Secondary | ICD-10-CM

## 2013-03-03 DIAGNOSIS — I10 Essential (primary) hypertension: Secondary | ICD-10-CM

## 2013-03-03 DIAGNOSIS — F0280 Dementia in other diseases classified elsewhere without behavioral disturbance: Secondary | ICD-10-CM

## 2013-03-03 NOTE — Progress Notes (Signed)
PROGRESS NOTE  DATE: 03-03-13  FACILITY: Nursing Home Location: [facility]  LEVEL OF CARE: SNF (31)  Routine Visit  CHIEF COMPLAINT:  Manage Alzheimer's dementia, hypertension and osteoporosis  HISTORY OF PRESENT ILLNESS:  REASSESSMENT OF ONGOING PROBLEM(S):  DEMENTIA: The dementia remaines stable and continues to function adequately in the current living environment with supervision.  The patient has had little changes in behavior. No complications noted from the medications presently being used. Dementia is advanced. Patient does not follow commands.  HTN: Pt 's HTN remains stable. Staff  Deny CP, sob, DOE, pedal edema, headaches, dizziness or visual disturbances.  No complications from the medications currently being used.  Last BP : 130/64, 146/80, 126/64.  OSTEOPOROSIS: The patient's osteoporosis remains stable. The staff deny recent worsening of pain. There has not been any evidence of fractures. No complications noted from the medications presently being used.  PAST MEDICAL HISTORY : Reviewed.  No changes.  CURRENT MEDICATIONS: Reviewed per [facility]  REVIEW OF SYSTEMS: Unobtainable due to dementia  PHYSICAL EXAMINATION  VS:  T 97       P 66      RR 20    BP 126/64     POX % 98     WT (Lb)  GENERAL: no acute distress, normal body habitus NECK: supple, trachea midline, no neck masses, no thyroid tenderness, no thyromegaly RESPIRATORY: breathing is even & unlabored, BS CTAB CARDIAC: RRR, no murmur,no extra heart sounds, no edema GI: abdomen soft, normal BS, no masses, no tenderness, no hepatomegaly, no splenomegaly PSYCHIATRIC: the patient is alert & disoriented, affect & behavior appropriate  LABS/RADIOLOGY:  5-14 albumin 3.3 otherwise liver profile normal  4/14 BMP normal, hemoglobin 11.3, MCV 76.5 otherwise CBC normal  ASSESSMENT/PLAN:  Alzheimer's dementia-advanced. Hypertension-well controlled osteoporosis-continue current  medications. CAD-stable. constipation-well-controlled. anemia of chronic disease-stable.  CPT CODE: 08657

## 2013-04-15 ENCOUNTER — Non-Acute Institutional Stay (SKILLED_NURSING_FACILITY): Payer: Medicare Other | Admitting: Adult Health

## 2013-04-15 DIAGNOSIS — K5900 Constipation, unspecified: Secondary | ICD-10-CM

## 2013-04-15 DIAGNOSIS — I10 Essential (primary) hypertension: Secondary | ICD-10-CM

## 2013-04-15 DIAGNOSIS — I2510 Atherosclerotic heart disease of native coronary artery without angina pectoris: Secondary | ICD-10-CM

## 2013-04-15 DIAGNOSIS — K219 Gastro-esophageal reflux disease without esophagitis: Secondary | ICD-10-CM

## 2013-04-15 DIAGNOSIS — M810 Age-related osteoporosis without current pathological fracture: Secondary | ICD-10-CM

## 2013-04-15 NOTE — Progress Notes (Signed)
Patient ID: [name] [name], female   DOB: 01-Nov-1921, 79 y.o.   MRN: [id number]       PROGRESS NOTE  DATE: 04/15/2013  FACILITY: Nursing Home Location: [facility]  LEVEL OF CARE: SNF (31)  Routine Visit  CHIEF COMPLAINT:  Manage  HISTORY OF PRESENT ILLNESS:  REASSESSMENT OF ONGOING PROBLEM(S):  HTN: Pt 's HTN remains stable.  Denies CP, sob, DOE, pedal edema, headaches, dizziness or visual disturbances.  No complications from the medications currently being used.  Last BP : 136/78  CAD: The angina has been stable. The patient denies dyspnea on exertion, orthopnea, pedal edema, palpitations and paroxysmal nocturnal dyspnea. No complications noted from the medication presently being used.  GERD: pt's GERD is stable.  Denies ongoing heartburn, abd. Pain, nausea or vomiting.  Currently on a PPI & tolerates it without any adverse reactions.   PAST MEDICAL HISTORY : Reviewed.  No changes.  CURRENT MEDICATIONS: Reviewed per [facility]  REVIEW OF SYSTEMS: Unobtainable due to Dementia   PHYSICAL EXAMINATION  VS:  T97       P67      RR18      BP136/78     POX95 %     WT136.4 (Lb)  GENERAL: no acute distress, normal body habitus NECK: supple, trachea midline, no neck masses, no thyroid tenderness, no thyromegaly LYMPHATICS: no LAN in the neck, no supraclavicular LAN RESPIRATORY: breathing is even & unlabored, BS CTAB CARDIAC: RRR, no murmur,no extra heart sounds, no edema GI: abdomen soft, normal BS, no masses, no tenderness, no hepatomegaly, no splenomegaly PSYCHIATRIC: the patient is alert to self; affect & behavior appropriate  LABS/RADIOLOGY: 02/28/13 prealbumin 20.19 5-14 albumin 3.3 otherwise liver profile normal 4/14 BMP normal, hemoglobin 11.3, MCV 76.5 otherwise CBC normal   ASSESSMENT/PLAN:  Hypertension - well controlled   Osteoporosis - continue Fosamax  CAD - continue Plavix  Constipation -  Continue Senna-S, Miralax and Enulose  GERD -  stable    CPT CODE: 84132

## 2013-05-19 ENCOUNTER — Non-Acute Institutional Stay (SKILLED_NURSING_FACILITY): Payer: Medicare Other | Admitting: Adult Health

## 2013-05-19 DIAGNOSIS — I10 Essential (primary) hypertension: Secondary | ICD-10-CM

## 2013-05-19 DIAGNOSIS — K5900 Constipation, unspecified: Secondary | ICD-10-CM

## 2013-05-19 DIAGNOSIS — I2510 Atherosclerotic heart disease of native coronary artery without angina pectoris: Secondary | ICD-10-CM

## 2013-05-19 DIAGNOSIS — F0390 Unspecified dementia without behavioral disturbance: Secondary | ICD-10-CM

## 2013-05-19 DIAGNOSIS — K219 Gastro-esophageal reflux disease without esophagitis: Secondary | ICD-10-CM

## 2013-05-19 DIAGNOSIS — M810 Age-related osteoporosis without current pathological fracture: Secondary | ICD-10-CM

## 2013-05-19 NOTE — Progress Notes (Signed)
Patient ID: [name] [name], female   DOB: 1921/07/09, 79 y.o.   MRN: [id number]       PROGRESS NOTE  DATE:  05/19/13  FACILITY: Nursing Home Location: [facility]  LEVEL OF CARE: SNF (31)  Routine Visit  CHIEF COMPLAINT:  Manage Hypertension, Dementia, GERD and CAD  HISTORY OF PRESENT ILLNESS:  REASSESSMENT OF ONGOING PROBLEM(S):  HTN: Pt 's HTN remains stable.  Denies CP, sob, DOE, pedal edema, headaches, dizziness or visual disturbances.  No complications from the medications currently being used.  Last BP : 129/74  OSTEOPOROSIS: The patient's osteoporosis remains stable. The patient denies recent worsening of pain and the range of motion remains stable. There has not been any evidence of fractures. No complications noted from the medications presently being used.  DEMENTIA: The dementia remaines stable and continues to function adequately in the current living environment with supervision.  The patient has had little changes in behavior. No complications noted from the medications presently being used.   PAST MEDICAL HISTORY : Reviewed.  No changes.  CURRENT MEDICATIONS: Reviewed per [facility]  REVIEW OF SYSTEMS: Unobtainable due to Dementia   PHYSICAL EXAMINATION  VS:  T98.8       P78     RR18     BP129/74         WT134.6 (Lb)  GENERAL: no acute distress, normal body habitus NECK: supple, trachea midline, no neck masses, no thyroid tenderness, no thyromegaly RESPIRATORY: breathing is even & unlabored, BS CTAB CARDIAC: RRR, no murmur,no extra heart sounds, no edema GI: abdomen soft, normal BS, no masses, no tenderness, no hepatomegaly, no splenomegaly PSYCHIATRIC: the patient is alert to self; affect & behavior appropriate  LABS/RADIOLOGY: 04/25/13 sodium 143 potassium 3.7 glucose 105 BUN 16 creatinine 1.0 calcium 9.3 02/28/13 prealbumin 20.19 5-14 albumin 3.3 otherwise liver profile normal 4/14 BMP normal, hemoglobin 11.3, MCV 76.5 otherwise CBC  normal   ASSESSMENT/PLAN:  Hypertension - well controlled; continue Norvasc  Osteoporosis - continue Fosamax  CAD - continue Plavix  Constipation -  Continue Senna-S, Miralax and Enulose  GERD - stable  Dementia - continue Aricept   CPT CODE: 86578

## 2013-05-25 ENCOUNTER — Encounter: Payer: Self-pay | Admitting: Internal Medicine

## 2013-05-25 ENCOUNTER — Non-Acute Institutional Stay (SKILLED_NURSING_FACILITY): Payer: PRIVATE HEALTH INSURANCE | Admitting: Internal Medicine

## 2013-05-25 DIAGNOSIS — M810 Age-related osteoporosis without current pathological fracture: Secondary | ICD-10-CM

## 2013-05-25 DIAGNOSIS — F0280 Dementia in other diseases classified elsewhere without behavioral disturbance: Secondary | ICD-10-CM

## 2013-05-25 DIAGNOSIS — I10 Essential (primary) hypertension: Secondary | ICD-10-CM

## 2013-05-25 DIAGNOSIS — I2510 Atherosclerotic heart disease of native coronary artery without angina pectoris: Secondary | ICD-10-CM

## 2013-05-25 NOTE — Progress Notes (Signed)
PROGRESS NOTE  DATE: 05-25-13  FACILITY: Nursing Home Location: [facility]  LEVEL OF CARE: SNF (31)  Routine Visit  CHIEF COMPLAINT:  Manage Alzheimer's dementia, hypertension and osteoporosis  HISTORY OF PRESENT ILLNESS:  REASSESSMENT OF ONGOING PROBLEM(S):  DEMENTIA: The dementia remaines stable and continues to function adequately in the current living environment with supervision.  The patient has had little changes in behavior. No complications noted from the medications presently being used. Dementia is advanced. Patient does not follow commands.  HTN: Pt 's HTN remains stable. Staff  Deny CP, sob, DOE, pedal edema, headaches, dizziness or visual disturbances.  No complications from the medications currently being used.  Last BP : 130/64, 146/80, 126/64, 112/58.  OSTEOPOROSIS: The patient's osteoporosis remains stable. The staff deny recent worsening of pain. There has not been any evidence of fractures. No complications noted from the medications presently being used.  PAST MEDICAL HISTORY : Reviewed.  No changes.  CURRENT MEDICATIONS: Reviewed per [facility]  REVIEW OF SYSTEMS: Unobtainable due to dementia  PHYSICAL EXAMINATION  VS:  T 97.6       P 53      RR 20    BP 112/58     POX % 98     WT (Lb)  GENERAL: no acute distress, normal body habitus EYES: unable to assess NECK: supple, trachea midline, no neck masses, no thyroid tenderness, no thyromegaly LYMPHATICS: no cervical LAN, no supraclavicular LAN RESPIRATORY: breathing is even & unlabored, BS CTAB CARDIAC: RRR, no murmur,no extra heart sounds, no edema GI: abdomen soft, normal BS, no masses, no tenderness, no hepatomegaly, no splenomegaly PSYCHIATRIC: the patient is alert & disoriented, affect & behavior appropriate  LABS/RADIOLOGY:  11-14 bmp nl 10-14 cbc nl  5-14 albumin 3.3 otherwise liver profile normal  4/14 BMP normal, hemoglobin 11.3, MCV 76.5 otherwise CBC  normal  ASSESSMENT/PLAN:  Alzheimer's dementia-advanced. Hypertension-well controlled osteoporosis-continue current medications. CAD-stable. constipation-well-controlled. anemia of chronic disease-stable. Check liver profile  CPT CODE: 16109

## 2013-06-24 ENCOUNTER — Non-Acute Institutional Stay (SKILLED_NURSING_FACILITY): Payer: PRIVATE HEALTH INSURANCE | Admitting: Internal Medicine

## 2013-06-24 DIAGNOSIS — I10 Essential (primary) hypertension: Secondary | ICD-10-CM

## 2013-06-24 DIAGNOSIS — G309 Alzheimer's disease, unspecified: Secondary | ICD-10-CM

## 2013-06-24 DIAGNOSIS — J189 Pneumonia, unspecified organism: Secondary | ICD-10-CM

## 2013-06-24 DIAGNOSIS — J984 Other disorders of lung: Secondary | ICD-10-CM | POA: Insufficient documentation

## 2013-06-24 DIAGNOSIS — E860 Dehydration: Secondary | ICD-10-CM

## 2013-06-24 DIAGNOSIS — F0280 Dementia in other diseases classified elsewhere without behavioral disturbance: Secondary | ICD-10-CM

## 2013-06-24 NOTE — Progress Notes (Signed)
PROGRESS NOTE  DATE: 06-24-13  FACILITY: Nursing Home Location: [facility]  LEVEL OF CARE: SNF (31)  Routine/acute Visit  CHIEF COMPLAINT:  Manage pneumonitis, Alzheimer's dementia and hypertension  HISTORY OF PRESENT ILLNESS:  REASSESSMENT OF ONGOING PROBLEM(S):  PNEUMONITIS: New problem. Chest x-ray today shows patchy pneumonitis. Patient does not follow commands. Staff do not report respiratory insufficiency.  DEMENTIA: The dementia remaines stable and continues to function adequately in the current living environment with supervision.  The patient has had little changes in behavior. No complications noted from the medications presently being used. Dementia is advanced. Patient does not follow commands.  HTN: Pt 's HTN remains stable. Staff  Deny CP, sob, DOE, pedal edema, headaches, dizziness or visual disturbances.  No complications from the medications currently being used.  Last BP : 130/64, 146/80, 126/64, 112/58.  PAST MEDICAL HISTORY : Reviewed.  No changes.  [facility]: [facility]  REVIEW OF SYSTEMS: Unobtainable due to dementia  PHYSICAL EXAMINATION  VS:  Not available  GENERAL: no acute distress, normal body habitus EYES: unable to assess NECK: supple, trachea midline, no neck masses, no thyroid tenderness, no thyromegaly LYMPHATICS: no cervical LAN, no supraclavicular LAN RESPIRATORY: breathing is even & unlabored, BS diminished CARDIAC: RRR, no murmur,no extra heart sounds, no edema GI: abdomen soft, normal BS, no masses, no tenderness, no hepatomegaly, no splenomegaly PSYCHIATRIC: the patient is alert & disoriented, affect & behavior appropriate MUSCULOSKELETAL: Unable to assess NEUROLOGICAL: Unable to assess, patient not following commands  LABS/RADIOLOGY:  12-14 sodium 159, chloride 122, BUN 28 otherwise CMP normal, CBC normal, hemoglobin A1c 6.3, TSH 1.62, total cholesterol 324, HDL 58, and PO2 38, triglycerides  141  11-14 bmp nl 10-14 cbc nl  5-14 albumin 3.3 otherwise liver profile normal  4/14 BMP normal, hemoglobin 11.3, MCV 76.5 otherwise CBC normal  ASSESSMENT/PLAN:  Pneumonitis-new onset significant problem. Agree with initiating ceftriaxone and duonebs. Dehydration-agree with IV fluids Alzheimer's dementia-advanced. Hypertension-Norvasc was decreased. Request blood pressure charting. Hyperlipidemia-new problem. Zocor was started CAD-stable. constipation-well-controlled. anemia of chronic disease-stable.  Management discussed with [facility] nurse practitioner  CPT CODE: [id number]

## 2013-07-24 DEATH — deceased
